# Patient Record
Sex: Female | Born: 1985 | Race: White | Hispanic: No | Marital: Married | Smoking: Never smoker
Health system: Southern US, Community
[De-identification: ages and names within clinical notes are randomized; demographics above are authoritative.]

## PROBLEM LIST (undated history)

## (undated) DIAGNOSIS — G90A Postural orthostatic tachycardia syndrome (POTS): Secondary | ICD-10-CM

## (undated) DIAGNOSIS — K589 Irritable bowel syndrome without diarrhea: Secondary | ICD-10-CM

## (undated) DIAGNOSIS — M359 Systemic involvement of connective tissue, unspecified: Secondary | ICD-10-CM

## (undated) HISTORY — DX: Postural orthostatic tachycardia syndrome (POTS): G90.A

## (undated) HISTORY — DX: Systemic involvement of connective tissue, unspecified: M35.9

---
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
## 2007-02-09 ENCOUNTER — Ambulatory Visit (HOSPITAL_COMMUNITY): Payer: Self-pay | Admitting: Psychiatry

## 2007-05-17 ENCOUNTER — Ambulatory Visit (HOSPITAL_COMMUNITY): Payer: Self-pay | Admitting: Psychiatry

## 2007-07-12 ENCOUNTER — Ambulatory Visit (HOSPITAL_COMMUNITY): Payer: Self-pay | Admitting: Psychiatry

## 2007-10-11 ENCOUNTER — Ambulatory Visit (HOSPITAL_COMMUNITY): Payer: Self-pay | Admitting: Psychiatry

## 2008-05-08 ENCOUNTER — Ambulatory Visit (HOSPITAL_COMMUNITY): Payer: Self-pay | Admitting: Psychiatry

## 2008-11-02 ENCOUNTER — Ambulatory Visit (HOSPITAL_COMMUNITY): Payer: Self-pay | Admitting: Psychiatry

## 2009-04-26 ENCOUNTER — Ambulatory Visit (HOSPITAL_COMMUNITY): Payer: Self-pay | Admitting: Psychiatry

## 2010-09-06 NOTE — H&P (Signed)
NAME:  Kelly Burke, Kelly Burke                         ACCOUNT NO.:  [id number]   MEDICAL RECORD NO.:  [id number]                   PATIENT TYPE:  INP   LOCATION:  0103                                 FACILITY:  [facility]   PHYSICIAN:  Beverly Milch, MD                  DATE OF BIRTH:  1985/10/15   DATE OF ADMISSION:  06/08/2003  DATE OF DISCHARGE:                         PSYCHIATRIC ADMISSION ASSESSMENT   IDENTIFYING DATA:  This 25 year old female, ninth grade student at [employer], was admitted emergently voluntarily on referral from  Dr. Duanne Guess of [facility] for inpatient  stabilization of depression and suicide plan to overdose.  The patient was  referred from apparently her first appointment at [facility] but  has a long history of depression and anxiety.  She suggests some previous  psychotherapy in an undisclosed location years ago for these problems.  She  is currently having progressive suicidal ideation and exacerbation of mood  and anxiety symptoms over the last two months even though the problems are  chronic.   HISTORY OF PRESENT ILLNESS:  The patient hesitates to open up and explain  the symptoms.  She cooperates enough to feel herself cordial and reasonable  but she is not sincere or resolving regarding accessing the conflicts and  the factors contributing to suicidal ideation.  The patient acknowledges  having multiple worries.  She feels that her father does not understand, so  she does not talk to father about problems.  Mother is overwhelmed, being  the patient's confident.  Mother notes mood swings, though these seem to be  produced by the patient's dependent compensations for her anxiety and  dysphoria.  She is dissatisfied with herself, her life and her future and  this seems to be present for at least 1-2 years.  Generalized anxiety seems  much longer with multiple somatic complaints.  The patient takes  multiple  medications for everything from asthma and allergy to constipation and  health and sleep concerns.  She tries to stay calm and to keep everything  inside but is not having success.  She is irritable, particularly at her  little brother.  She has significant guilty ruminations and feels hopeless  and helpless as well as worthless.  She is more irritable as she becomes  more anxious.  She does not acknowledge hallucinations or delusions.  She  does not have paranoia or dissociation.  The patient suggests she has not  resolved grandfather's death nine years ago and this may interfere with her  relations with father as well.  The family may move and they feel there is a  lot of upheaval in the family with father switching jobs and mother laid off  but having to go out of town on the weekend.  Grandmother had a stroke last  April, so that mother is with grandmother on weekends out of town.  The  family is financially stressed.  Father avoids acknowledging problems and  brother is upset about them.  The patient is anxious about school.  She can  exercise at times and play with her dog.   PAST MEDICAL HISTORY:  The patient is under the primary care of Dr. Azalia Bilis with [facility].  She had menarche at age 27 and menses are  regular.  She is not sexually active.  She has long history of seasonal  allergic rhinitis and asthma.  She has lax joints.  She reports a history of  heart murmur.  She also reports having subungual nevus of the right great  toenail.  She has an acute pharyngitis and has been on amoxicillin for four  days and states she must complete that treatment as well as continue her  Zyrtec-D and Singulair.  She has no history of seizure or syncope.  She has  no heart murmur or arrhythmia.   MEDICATIONS:  At the time of admission, her medications are not psychotropic  but include the following:   1. Amoxicillin 500 mg, take 2 b.i.d. x 6 days remaining.  2.  Zyrtec-D 1 b.i.d.  3. Singulair 10 mg at bedtime.  4. Melatonin 2 mg at bedtime, though she has not been taking it for some     time.  5. Vitamin C.  6. Calcium.  7. Fiber laxative.   ALLERGIES:  The patient has no medication allergies.   REVIEW OF SYSTEMS:  The patient denies difficulty gait, gaze or continence.  She denies exposure to communicable disease or toxins.  She denies rash,  jaundice or purpura.  There is no chest pain, palpitations or presyncope.  There is no abdominal pain, nausea, vomiting or diarrhea.  There is no  dysuria or arthralgia.   IMMUNIZATIONS:  Up to date.   FAMILY HISTORY:  The patient lives with both parents and younger brother.  She fusses at her younger brother unfairly.  Her maternal grandmother and  maternal great-aunt have depression.  Paternal cousins have ADHD and brother  has ADHD.  Maternal great-uncle has substance abuse with alcohol as does the  maternal great-aunt.  There is a family history of heart disease,  hypertension, glaucoma, hypotonia, cerebral palsy, stroke and cancer.   SOCIAL AND DEVELOPMENTAL HISTORY:  The patient is in the ninth grade at  [employer].  She has had no learning disabilities.  In  fact, she is an excellent student in her academics.  She is somewhat  immature in her social and relational life.  Her mood swings seem to be a  dependent compensation for her anxiety and depression.  The patient does not  acknowledge sexual activity.  She denies any substance abuse.  She is not  rule-breaking in general but is currently decompensating.   ASSETS:  The patient is intelligent.   MENTAL STATUS EXAM:  Weight is 136 pounds and height is 68 inches with blood  pressure 140/92 and heart rate 127 (supine) and standing blood pressure is  122/80 with heart rate of 144, though she is highly anxious at the time of  admission.  The patient has an intact neurological exam.  She has no abnormal involuntary  movements.  Gait and gaze are intact.  There are no  pathologic reflexes or soft neurologic findings.  Muscle strength and tone  are normal.  Cranial nerves 2-12 are intact.  AMRs and DTRs are intact.  She  is alert and oriented with speech  intact.  The patient has severe  intrapsychic anxiety and moderate to severe dysphoria.  She has chronic  dissatisfaction and disappointment with herself, her life and her future.  She presents some melancholic features that seem more likely to originate in  her dysthymic disorder than to have a superimposed major depression, though  such should be ruled out.  She has identity diffusion with dependent  features.  Her mood swings are seemingly accounted for by her dependent  compensation for anxiety and dysphoria as well as unresolved conflict.  She  has no psychotic symptoms.  She has intermittent suicidal ideation for the  last two months and now a plan to overdose.  She is not homicidal or  assaultive.  Repeat blood pressure the following day is 99/66 with heart  rate of 88 (supine) and standing blood pressure 101/68 with heart rate of  101.   IMPRESSION:   AXIS I:  1. Dysthymic disorder, early onset, severe with atypical features.  2. Generalized anxiety disorder.  3. Identity disorder with dependent features.  4. Parent-child problem.  5. Other specified family circumstances.   AXIS II:  Diagnosis deferred.   AXIS III:  1. Partially treated pharyngitis.  2. Allergic rhinitis and asthma.  3. History of heart murmur in the past.  4. History of right great toe subungual nevus.   AXIS IV:  Stressors:  Family--severe, acute and chronic; school--moderate,  acute; phase of life--severe, acute.   AXIS V:  Global Assessment of Functioning on admission 42; highest in the  last year 74.   PLAN:  The patient was admitted for inpatient adolescent psychiatric and  multidisciplinary, multimodal behavioral health treatment in a team-based  program at  a locked psychiatric unit.  The patient will start Remeron  pharmacotherapy and mother indicates that she should have a fiber laxative  to assure that she will not get constipated.  Will continue her Zyrtec-D,  amoxicillin as well as Singulair as required.  She will not need the  melatonin, vitamin C or calcium at this time but may resume this after  discharge.  Cognitive behavioral, desensitization and coping and social  skills therapies are planned.  Mobilization of anger and maturation are  planned and then can address with anger management to overcome regression.  Family therapy is also planned.   ESTIMATED LENGTH OF STAY:  Five to six days with target symptoms for  discharge being stabilization of suicide risk and mood, stabilization of  anxiety and regression and generalization of the capacity for safe,  effective participation in outpatient treatment.                                               Beverly Milch, MD   GJ/MEDQ  D:  06/09/2003  T:  06/09/2003  Job:  [id number]

## 2010-09-06 NOTE — H&P (Signed)
NAME:  GENETTE, HUERTAS                         ACCOUNT NO.:  [id number]   MEDICAL RECORD NO.:  [id number]                   PATIENT TYPE:  INP   LOCATION:  0199                                 FACILITY:  [facility]   PHYSICIAN:  Beverly Milch, MD                  DATE OF BIRTH:  10/19/1985   DATE OF ADMISSION:  10/17/2003  DATE OF DISCHARGE:                         PSYCHIATRIC ADMISSION ASSESSMENT   IDENTIFYING DATA:  This 73-1/23-year-old female, who will repeat the ninth  grade this fall, apparently at a school different from her [address], is admitted emergently voluntarily from [facility] Access and Intake Department where she was brought by mother  for inpatient stabilization of suicide risk, depression and anxiety.  Clarification of the optimal psychotherapeutic response to mother and  patient's presentation that the patient cannot keep from harming herself was  reviewed including an attempt made to review with Dr. Francia Greaves office  at 17:15.  The patient has failed the ninth grade after she and mother used  homebound to approach having missed many honors classes last school year.  The patient does not want to leave her home.  She is disappointed, hopeless,  helpless as well as being anxious and dissonant.  She reports auditory  hallucinations twice, including the first time, commanding that she kill  herself.  She has a plan to kill herself by overdose but has cut her wrist  with her nails the day before admission.   HISTORY OF PRESENT ILLNESS:  The patient is known from inpatient stay at the  [facility] February 17 through June 14, 2003.  This  hospitalization occurred from the first appointment with Dr. Janean Sark  Rembert with whom the patient continues psychotherapy as an outpatient  currently.  The patient indicates that she has support from mother and from  Dr. Heriberto Antigua.  The patient has recently changed doctors from Dr.  Mariana Single to  Dr. Milford Cage and has seen Dr. Katrinka Blazing once.  The patient has had a  medication change since her last hospitalization when she was discharged on  Remeron 15 mg nightly.  At this time, she arrived stating she takes 120 mg  of Cymbalta nightly.  The patient has also been receiving Sonata 10 mg at  bedtime for sleep.  The patient was suspected of possibly having a major  depressive episode during her last hospitalization but clinical course  documented dysthymic disorder without a major depressive episode in February  of 2005.  The patient's readmission has parallels with her first  hospitalization.  She seems morbidly fixated on illness and problems and  fuses with mother in that regard.  The patient and mother feel they cannot  establish self-control for safety.  The patient describes auditory  hallucinations commanding her to kill herself one month ago and then again  the night before admission when she heard her mother's voice  calling her  name but mother was not there.  The patient has insomnia, being worse again.  She has lost weight from 136 pounds on admission and 140 pounds on discharge  in February of 2005, now at 130 pounds.  The patient wonders if she has  anorexia.  She is somatically fixated on having multiple medical problems  including migraines, asthma, heart murmur and acne.   ADMISSION DIAGNOSES:  1. Cymbalta, reportedly 120 mg at bedtime, to be confirmed with Dr. Michaelle Copas     office.  2. Sonata 10 mg every bedtime.  3. Singulair 10 mg every bedtime.  4. Zyrtec D daily.  5. Fiber laxative p.r.n.  6. Albuterol inhaler p.r.n.   The patient's symptoms become self-defeating and mutually exacerbating such  that she and mother cannot work through a solution.  Disengagement from the  symptoms could not be accomplished in the access and intake office nor could  the patient commit or contract for safety in cooperation.  Mother could not  be reassuring.  The  patient is no longer seeing Dr. Mariana Single but now Dr.  Milford Cage for one visit.  The patient denies use of alcohol or illicit  drugs.  She denies other misperceptions.  She does not have post-traumatic  flashbacks and does not acknowledge significant psychic trauma.  She lives  with parents and is very dependent on mother.   PAST MEDICAL HISTORY:  The patient has a history of functional heart murmur.  She has a history of asthma and allergic rhinitis.  She has a history of  limited migraines occasionally.  She had menarche at age 47 and is not  sexually active.  She has had extraction of her wisdom teeth.  She is under  the pediatric care of Dr. Blanca Friend at [facility].  She has some  superficial lacerations from her fingernails on the forearm at the time of  admission.  She has had a history of a right great toe subungual nevus.  She  has had no medication allergies.  She has no history of seizure, arrhythmia  or syncope.   REVIEW OF SYSTEMS:  The patient denies difficulty with gait, gaze or  countenance.  She denies exposure to communicable disease or toxins.  She  denies rash, jaundice or purpura.  There is no chest pain, palpitations or  presyncope.  There is no abdominal pain, nausea, vomiting or diarrhea  currently.  There is no dysuria or arthralgia, though mother has worries  about many of these areas.   IMMUNIZATIONS:  Up to date.   FAMILY HISTORY:  The patient resides with both parents.  She has a brother  with ADHD as well as paternal cousins having the same.  Maternal grandmother  and maternal great-aunt have depression.  Maternal great-uncle and maternal  great-aunt have alcohol abuse.  The patient seems to fuse with enabling  mother.   SOCIAL AND DEVELOPMENTAL HISTORY:  The patient has failed the ninth grade at  [employer], missing many honors classes and using homebound as a substitution or makeup unsuccessfully.  She plans to transfer   schools to repeat the ninth grade or pursue some other arrangement. She  denies use of alcohol or illicit drugs.  She is not sexually active.   ASSETS:  The patient is intelligent.   MENTAL STATUS EXAM:  Height is 64-3/4 inches and weight is 130 pounds with  blood pressure 113/82 and heart rate 129 (sitting) and 104/58 with heart  rate of 150 (standing) suggesting significant anxiety.  The patient has an  intact neurological exam.  She manifests moderate psychomotor slowing that  seems dependent and depressive as well as anxious in origin.  The patient  has no abnormal involuntary movements.  There are no neurologic soft signs  or pathologic reflexes.  AMRs, cranial nerves and muscle strengths and tone  are normal.  Gait and gaze are intact.  She is alert and oriented fully.  The patient reports irresistible urge to harm herself including __________  and superficially lacerating her forearm with nails the day before admission  and planned to overdose on the day of admission.  She has morbid fixations  and seems hopeless and helpless in her dependence.  She has increased  avoidance and generalized anxiety.  She has severe dysthymic dysphoria.  She  reports auditory hallucinations twice, once of a command auditory  hallucinations to kill herself and, the night before admission, hearing her  mother's voice calling her name even though mother was not there.  She has  no other misperceptions at this time.  However, she does not open up readily  and address her problems but tends to talk only to mother and her therapist.  She does not use alcohol or illicit drugs.  She has active suicidal ideation  but is not homicidal or assaultive.   IMPRESSION:   AXIS I:  1. Dysthymic disorder, early onset, severe  2. Generalized anxiety disorder.  3. Identity disorder with dependent features.  4. Parent-child problem.  5. Other specified family circumstances.   AXIS II:  Diagnosis deferred.   AXIS  III:  1. Abrasions and lacerations.  2. Allergic rhinitis and asthma.  3. Irritable bowel syndrome.  4. Functional heart murmur.  5. Acne.  6. Weight loss of 6-10 pounds.   AXIS IV:  Stressors:  Family--severe, acute and chronic; school--severe,  acute and chronic; phase of life--severe, acute.   AXIS V:  Current Global Assessment of Functioning 41; highest Global  Assessment of Functioning in the last year 71.   PLAN:  The patient is admitted for inpatient adolescent psychiatric and  multidisciplinary, multimodal behavioral health treatment in a team-based  program at a locked psychiatric unit.  Cymbalta will initially be dosed at  60 mg nightly to modify, as per Dr. Michaelle Copas office, when they can be  reached.  Kathaleen Bury is not available in the hospital formulary and will  substitute Vistaril until home supply brought or discharge.  Will continue  other usual medications and assess the need for additional psychopharmacotherapy.  Will attempt to disengage from somatic fixations.  Individuation and separation as well as identity consolidation are important  to long-term treatment success.  Cognitive behavioral therapy, habit  reversal and object relations family therapy are planned currently.   ESTIMATED LENGTH OF STAY:  Five days with target symptoms for discharge  being stabilization of suicide risk and mood, stabilization of anxiety and  somatic fixations and generalization of the capacity for safe, effective  participation in outpatient treatment.                                               Beverly Milch, MD    GJ/MEDQ  D:  10/18/2003  T:  10/18/2003  Job:  [id number]

## 2010-09-06 NOTE — Discharge Summary (Signed)
NAME:  Kelly Burke, Kelly Burke                         ACCOUNT NO.:  [id number]   MEDICAL RECORD NO.:  [id number]                   PATIENT TYPE:  INP   LOCATION:  0103                                 FACILITY:  [facility]   PHYSICIAN:  Beverly Milch, MD                  DATE OF BIRTH:  [birth year]   DATE OF ADMISSION:  06/08/2003  DATE OF DISCHARGE:  06/14/2003                                 DISCHARGE SUMMARY   IDENTIFYING DATA:  This 25 year old female, ninth grade student at [employer], was admitted voluntarily on referral from Dr. Duanne Guess of [facility] for inpatient stabilization of  suicide risk including plan to overdose and depression.  The patient had a  long history of depression and anxiety but this was her first appointment at  [facility] on the day of admission.  She may have had  some previous psychotherapy remotely but cannot recall when or where.  She  has had suicidal ideation over the last two months.  For full details,  please see the typed admission assessment.   HISTORY OF PRESENT ILLNESS:  The patient is hesitant to open up and discuss  her symptoms.  She seems somewhat fixated in competition with her younger  brother.  Mother is overwhelmed as the patient confides all her problems in  mother and mother notes mood swings in addition to the patient's anxiety and  dysphoria.  The patient has generalized anxiety with multiple somatic  complaints.  Depressive symptoms are present for likely at least 1-2 years  and anxiety symptoms longer than that.  The patient does note some guilty  ruminations and feels relative hopeless and helpless at the time of  admission.  Mother is gone on weekends to care for her own mother out of  town.  The patient is under the primary care of Dr. Azalia Bilis and has  seasonal allergic rhinitis and asthma.  She reports a subungual nevus to the  right great toe and is completing  amoxicillin currently for pharyngitis.  She has no medication allergies.  Mother is concerned about the patient's  tendency to irritable bowel and constipation.  There is depression in a  maternal grandmother and maternal great-aunt.  Paternal cousins have ADHD as  does brother.  Maternal great-uncle has substance abuse with alcohol as does  the maternal great-aunt.   INITIAL MENTAL STATUS EXAM:  The patient has chronic dissatisfaction and  disappointment with herself, her life and her future.  She presents with  severe intrapsychic anxiety and moderate to severe dysphoria objectively.  She presents with a dysthymic disorder with possible superimposed major  depression with some melancholic features, particularly subjectively.  She  has a plan to overdose and a two-month history of intermittent suicidal  ideation.   LABORATORY DATA:  CBC was normal with white count 6900, hemoglobin 13.2, MCV  of 91  and platelet count 299,000.  Basic metabolic panel was normal with  sodium 140, potassium 3.8, glucose 86, creatinine 0.8 and calcium 10.2 with  albumin 3.7.  Hepatic function panel was normal with AST 14 and ALT 13 and  total bilirubin 0.7.  GGT was normal at 13.  TSH was normal at 3.993.  Urine  hCG was negative.  Urine drug screen was negative.  Urinalysis was normal  with specific gravity of 1028.  RPR was nonreactive.  Urine GC and CT probes  by DNA amplification were negative.   HOSPITAL COURSE AND TREATMENT:  General medical exam, by Vic Ripper,  P.A.-C., noted no medication allergies.  The family was stressed by mother  and father changing jobs.  The patient has a history of surgical removal of  her wisdom teeth.  Maternal grandmother has depression.  The patient had  menarche at age 52 and is not sexually active.  She may have a heart murmur  by history as well as occasional migraine.  General medical findings were  otherwise intact, particularly on examination.  The  patient's admission  height was 68 inches with weight of 136 pounds, blood pressure 140/92 and  heart rate 127 (supine) at the time of admission and blood pressure 122/80  with heart rate of 144 (standing), though she was anxious.  Repeat blood  pressure, the following day, was 99/66 with heart rate of 88 (supine) and  standing blood pressure 101/68 with heart rate of 101.  At the time of  discharge, blood pressure was 101/64 with heart rate of 96 (supine) and  88/44 with heart rate of 160 (standing).  Some of the patient's lability was  likely technical measurement process but overall  the patient appears to  have some vasomotor instability likely concomitant to her anxiety diathesis  as well.  Her weight was 140 pounds at the time of discharge.  She was not  felt to have any significant orthostasis from Remeron. She was started on  Remeron 15 mg nightly and tolerated the medication well.  She had no  significant side effects.  Her anxiety and depression did gradually  stabilize efficiently for the patient to participate very effectively in the  therapy process.  She participated in group, milieu, individual, family,  special education, anger management, occupation and therapeutic recreational  therapies.  The patient became capable of verbalizing her symptoms  psychologically and disengaging from somatoform fixations.  She did complain  of the food at the hospital and had stomachache at least one or two  mornings, limiting breakfast intake.  This was resolving by the time of  discharge.  She did complete the course of her amoxicillin during the  hospital stay.  The patient was not self-directed in treatment by the time  of discharge but was much more capable of participating under therapy  direction.  Remeron was safe and well-tolerated.  She had a final family  therapy session with both parents with brother remaining at school.  She generalized the communication skills to the family  level and did work on  several family issues in the session that could also be generalized for  outcome.  The patient was discharged free of suicidal ideation and with  improved function, safe for return home.  She did not manifest major  depressive symptoms over the course of the hospital stay.   FINAL DIAGNOSES:   AXIS I:  1. Dysthymic disorder, early onset, severe with atypical features.  2. Generalized anxiety  disorder.  3. Identity disorder with dependent features.  4. Parent-child problem.  5. Other specified family circumstances.   AXIS II:  Diagnosis deferred.   AXIS III:  1. Partially treated pharyngitis.  2. Allergic rhinitis and asthma.  3. History of cardiac murmur in the past.  4. History of right great toe subungual nevus.   AXIS IV:  Stressors:  Family--severe, acute and chronic; school--moderate,  acute; phase of life--severe, acute.   AXIS V:  Global Assessment of Functioning on admission 42; highest in last  year 74; discharge Global Assessment of Functioning 55.   CONDITION ON DISCHARGE:  The patient was discharged to parents in improved  condition free of suicidal ideation and safe for discharge.  She has crisis  and safety plans if needed.   DISCHARGE MEDICATIONS:  1. Remeron 15 mg tablet every bedtime; quantity #30 with one refill     prescribed.  2. Singulair 10 mg every bedtime; having a home supply.  3. Zyrtec-D 1 tablet twice daily; having a home supply.  4. Fiber laxative once daily; having a home supply.  5. Calcium twice daily; having a home supply.  6. Vitamin C every morning; having a home supply.   FOLLOW UP:  A copy of the patient's laboratory testing was sent with the  patient for use in upcoming medication monitoring appointment.  She will see  Dr. Mariana Single for this on July 19, 2003 at 9:15 a.m.  She will see Duanne Guess, Ph.D. on June 15, 2003 at 14:00 and June 19, 2003 at 17:00.   ACTIVITY AND DIET:  She follows a high  fiber diet and is encouraged to be  physically active with no restrictions on activity.   She will call for any interim difficulties, especially regarding medication  and they are educated on the FDA guideline for use of antidepressants in  persons less than age 26 regarding 1/50 being estimated by the FDA to have  suicide-related side effects and to require monitoring in a tapering fashion  over 90 days with parents and professionals.                                               Beverly Milch, MD    GJ/MEDQ  D:  06/15/2003  T:  06/17/2003  Job:  [id number]   cc:   Duanne Guess, Ph.D.  [facility]  [address]  [place], [place]  fax [phone]   Laney Pastor. Alnaquib, M.D.  Fax: [phone]

## 2010-09-06 NOTE — Discharge Summary (Signed)
NAME:  Kelly Burke, Kelly Burke                         ACCOUNT NO.:  [id number]   MEDICAL RECORD NO.:  [id number]                   PATIENT TYPE:  INP   LOCATION:  0104                                 FACILITY:  [facility]   PHYSICIAN:  Beverly Milch, MD                  DATE OF BIRTH:  [birth year]   DATE OF ADMISSION:  10/17/2003  DATE OF DISCHARGE:  10/24/2003                                 DISCHARGE SUMMARY   IDENTIFYING DATA:  This 45[age]1/23-year-old female, who will have to repeat the  ninth grade this fall, she hopes, at [employer], as she  failed the ninth grade at [employer] due to missing school and  school work with various somatic concerns is admitted emergently voluntarily  from [facility] for inpatient stabilization  of suicide risk and depression and anxiety.  The patient reports suicide  plan of overdosing and had cut her wrist bluntly the day before admission.  The patient has become progressively unable to leave her home due to anxiety  and progressively more despondent with herself and her life.  She is under  the ongoing psychiatric care of Dr. Milford Cage, seeing her only once thus  far and, prior to that, seeing Dr. Mariana Single.  She is under the ongoing  psychotherapeutic care of Dr. Janean Sark Rembert.  For full details, please  see the typed admission assessment.   HISTORY OF PRESENT ILLNESS:  The patient has one previous inpatient  treatment at [facility] June 08, 2003 through June 14, 2003.  She was discharged on Remeron 15 mg nightly from that  hospitalization, which was sought from her first outpatient psychotherapy  appointment with Dr. Heriberto Antigua as the patient opened up about suicidality with  her chronic depression.  She was not concluded to have major depressive  disorder as that hospitalization ensued but does have generalized anxiety  disorder and significant dependent characterologic  features.  At the time of  this admission, she is on Cymbalta 120 mg at bedtime and Sonata 10 mg every  bedtime in addition to Singulair 10 mg at bedtime, Zyrtec D daily, fiber  laxative p.r.n. and albuterol inhaler p.r.n.  She has lost weight from 136  pounds on last admission and 140 pounds on last discharge in February of  2005 to a current weight of 130 pounds.  The patient wonders if she has  anorexia.  She has multiple somatic fixations on migraines, asthma, heart  murmur and acne.  She is self-defeating and mutually exacerbated with her  various psychological and medical problems.  The patient overwhelms mother  and she depends on mother to solve these problems.  She had menarche at age  29 and is not sexually active.  She has a brother with ADHD and paternal  cousins with the same.  Maternal grandmother and maternal great-aunt have  depression.  Maternal  great-uncle and maternal great-aunt have alcohol  abuse.  Mother and patient seem to fuse in an enabling way.   INITIAL MENTAL STATUS EXAM:  The patient reports recent command auditory  hallucinations, commanding her to kill herself one month ago and the night  before admission and hearing her mother's voice calling her name in an odd  way.  The patient had severe dysthymic dysphoria with hopelessness and  helplessness, fixated in her dependence.  She has avoidant and generalized  anxiety with neurotic organization.  She has active suicidal ideation but is  not homicidal.   LABORATORY DATA:  Comprehensive metabolic panel was normal with sodium 141,  potassium 4.3, glucose 85, creatinine 0.7, calcium 9.7, AST 18, ALT 18,  albumin 3.6.  CBC was normal except MCHC borderline elevated at 34.8 with  reference range 32-34.  White count was normal at 7400, hemoglobin 13.4, MCV  of 89 and platelet count of 303,000.  TSH was normal at 3.131.  Urinalysis  was normal with specific gravity of 1.026 and pH 5.5.  Urine pregnancy test  was  negative.   HOSPITAL COURSE AND TREATMENT:  General medical exam, by Vic Ripper,  P.A.-C., noted past extraction of third molars and no medication allergies.  The patient suggested month at home is tense with mother losing her job and  maternal grandmother being depressed since having a stroke.  Father has a  new job and family is not getting along well with him with parents always  worried and talking about finances.  The patient reports history of heart  murmur, asthma, easy illness and headache.  Menses are regular with menarche  at age 68 and she is not sexually active.  She reports a history of heart  murmur, which is benign.  She is Tanner stage 5.  Admission weight was 130  pounds with height of 64-3/4 inches and blood pressure 113/82 and heart rate  of 129 (sitting) and standing blood pressure 104/58 with heart rate of 150  on admission.  At the time of discharge, the patient's weight was 132 pounds  and blood pressure 111/66 with heart rate of 91 (supine) and standing blood  pressure 83/55 with heart rate of 135, suggesting significant orthostatic  drop and this was episodic throughout the hospital stay, though not  consistent.  This was likely associated with weight loss.  Mother was afraid  of constipation and the patient used Citrucel once or twice daily but not  always consistently and did have a normal stool on the day of discharge.  The patient used Vistaril in place of Sonata as needed for sleep during her  hospital stay and did well without Sonata.  Mother felt the patient has  bipolar disorder from her study of the symptoms on the Internet.  Though the  patient reports times of rapid talking and normal to somewhat reactively  elevated mood, the patient primarily manifests pervasive dysphoria with  negative automatic cognitions and self-defeating dependency.  The patient  did participate in all aspects of treatment, including group, milieu, behavioral, individual,  family, special education, occupational and  therapeutic recreational as well as anger management therapies.  The patient  did manifest progressive independence in the course of hospital treatment  seemingly from her interactions with peers and adults in the treatment  program.  The patient did not regress with family therapy.  Mother was  highly worried about the patient and father, at the final family therapy  session, was anxious about  how to provide containment at home.  The patient  participated well in the final family therapy.  She expects college for her  profession as her goal.  The patient considers brother a source of  motivation to stop self-injury and to improve her mood.  She wants to be a  good role model and supportive for brother.  She requested more  communication from parents and feedback, even if for things she needs to  change.  The patient established a plan for family meetings at home and an  organizational list for family activities that can be coordinated among  members.  The family planned to work through high expressed emotion to  become more calm and supportive.  The patient suggested brother's eating  habits and father's negative moods trigger suicidal thoughts for her.  Again, a dependent superficial fixation instead of resolving underlying  dysthymic negative cognitions.  However, the family therapy could advance  understanding on the patient and parent's part of what work has to be done  in the future.  All medications, razors and other dangerous items are locked  at the home and crisis and safety plans are outlined if needed.  The  patient's Cymbalta was divided to 60 mg twice daily.  Lamictal was added for  augmentation, though mother also hoping it will stabilize mood lability at  home.  Sonata can be used p.r.n. at home, though it was not needed at the  hospital.  Precautions for orthostasis were outlined including adequate  hydration and nutrition and  the patient was gaining weight by the time of  discharge.   FINAL DIAGNOSES:   AXIS I:  1. Dysthymic disorder, early onset, severe with atypical features.  2. Generalized anxiety disorder.  3. Identity disorder with dependent features.  4. Parent-child problem.  5. Other specified family circumstances.   AXIS II:  Diagnosis deferred.   AXIS III:  1. Abrasions and lacerations.  2. Allergic rhinitis and asthma.  3. Irritable bowel syndrome.  4. Functional heart murmur.  5. Acne.  6. Mild orthostatic hypotension, asymptomatic, likely associated with 6-10     pound weight loss.   AXIS IV:  Stressors:  Family--severe, acute and chronic; school--severe,  acute and chronic; phase of life--severe, acute.   AXIS V:  Global Assessment of Functioning on admission 41; highest in the  last year 71 and discharge Global Assessment of Functioning 55.   CONDITION ON DISCHARGE:  The patient was tolerating Lamictal well at the time of discharge, having no rash or other side effects, though she stated  it took her a couple of days to get use to it because it caused some initial  drowsiness and dizziness.  The titration schedule to a targeted dose of 50  mg daily was established.   DISCHARGE MEDICATIONS:  1. Cymbalta 60 mg b.i.d. at breakfast and bedtime; quantity #60 with no     refill prescribed.  2. Lamictal 25 mg tablets, to take 1 every bedtime through November 01, 2003 for     a total of 15 days and then increase to 2 every bedtime; quantity #60     with no refill prescribed.  3. Sonata 10 mg every bedtime p.r.n. insomnia; having supply at home.  4. Singulair 10 mg every bedtime; having supply at home.  5. Zyrtec D every bedtime; having supply at home.  6. Albuterol inhaler as directed as needed for asthma; own supply at home.  7. Fiber laxative as needed as directed by  home supply.   DIET/ACTIVITY:  Hydration and nutrition education is provided and she has no  restrictions on activity  except orthostatic precautions.   FOLLOW UP:  Crisis and safety plans are outlined if needed.  She will see  Dr. Milford Cage October 30, 2003 at 15:30 for psychiatric care.  She will see  Duanne Guess, Ph.D. November 07, 2003 at 11 a.m. for individual and family  therapy.                                               Beverly Milch, MD    GJ/MEDQ  D:  10/25/2003  T:  10/26/2003  Job:  [id number]   cc:   Duanne Guess, Ph.D.  [address]  [place], [place] [address]   Jasmine Pang, M.D.  Fax: [phone]

## 2010-11-01 ENCOUNTER — Emergency Department (INDEPENDENT_AMBULATORY_CARE_PROVIDER_SITE_OTHER): Payer: BC Managed Care – PPO

## 2010-11-01 ENCOUNTER — Encounter: Payer: Self-pay | Admitting: *Deleted

## 2010-11-01 ENCOUNTER — Emergency Department (HOSPITAL_BASED_OUTPATIENT_CLINIC_OR_DEPARTMENT_OTHER)
Admission: EM | Admit: 2010-11-01 | Discharge: 2010-11-01 | Disposition: A | Discharge status: Home / Self Care | Payer: BC Managed Care – PPO | Attending: Emergency Medicine | Admitting: Emergency Medicine

## 2010-11-01 ENCOUNTER — Emergency Department (HOSPITAL_BASED_OUTPATIENT_CLINIC_OR_DEPARTMENT_OTHER): Payer: BC Managed Care – PPO

## 2010-11-01 DIAGNOSIS — S60229A Contusion of unspecified hand, initial encounter: Secondary | ICD-10-CM | POA: Insufficient documentation

## 2010-11-01 DIAGNOSIS — M25549 Pain in joints of unspecified hand: Secondary | ICD-10-CM

## 2010-11-01 DIAGNOSIS — Y92009 Unspecified place in unspecified non-institutional (private) residence as the place of occurrence of the external cause: Secondary | ICD-10-CM | POA: Insufficient documentation

## 2010-11-01 DIAGNOSIS — T148XXA Other injury of unspecified body region, initial encounter: Secondary | ICD-10-CM

## 2010-11-01 DIAGNOSIS — W19XXXA Unspecified fall, initial encounter: Secondary | ICD-10-CM

## 2010-11-01 NOTE — ED Notes (Signed)
Pt c/o right ahnd and wrist pain from fall today .

## 2010-11-01 NOTE — ED Notes (Signed)
Family at bedside. 

## 2010-11-01 NOTE — ED Provider Notes (Signed)
History   tripped / fell striking right hand against a doorjam  Chief Complaint  Patient presents with  . Hand Injury   Patient is a 25 y.o. female presenting with hand injury. The history is provided by the patient.  Hand Injury  The incident occurred 1 to 2 hours ago. The incident occurred at home. The injury mechanism was a fall. The pain is present in the right hand. The pain is mild. The symptoms are aggravated by palpation. She has tried ice for the symptoms. The treatment provided significant relief.    Past Medical History  Diagnosis Date  . Asthma     History reviewed. No pertinent past surgical history.  History reviewed. No pertinent family history.  History  Substance Use Topics  . Smoking status: Never Smoker   . Smokeless tobacco: Not on file  . Alcohol Use: No    OB History    Grav Para Term Preterm Abortions TAB SAB Ect Mult Living                  Review of Systems  Constitutional: Positive for unexpected weight change.  HENT: Negative.   Respiratory: Negative.   Musculoskeletal:       Trauma to right hand  Neurological: Negative.     Physical Exam  BP 125/73  Pulse 100  Temp(Src) 99.3 F (37.4 C) (Oral)  Resp 16  Wt 150 lb (68.04 kg)  SpO2 100%  LMP 10/16/2010  Physical Exam  Constitutional: She appears well-developed and well-nourished.  HENT:  Head: Normocephalic and atraumatic.  Eyes: EOM are normal.  Neck: Neck supple. No tracheal deviation present. No thyromegaly present.  Cardiovascular: Normal rate and regular rhythm.   No murmur heard. Pulmonary/Chest: Effort normal and breath sounds normal.  Abdominal: Soft. Bowel sounds are normal. She exhibits no distension. There is no tenderness.  Musculoskeletal: Normal range of motion. She exhibits no edema and no tenderness.       Right hand tender @ 3rd mcp joint dorsal aspect  Neurological: She is alert. Coordination normal.  Skin: Skin is warm and dry. No rash noted.  Psychiatric:  She has a normal mood and affect.    ED Course  Procedures  MDM Declines painm med in the ed. paln tylenol,return prn    Diagnoses that have been ruled out:  Diagnoses that are still under consideration:  Final diagnoses:  Contusion of unspecified site    Doug Sou, MD 11/01/10 1918

## 2010-11-01 NOTE — ED Notes (Signed)
MD at bedside. 

## 2015-04-13 ENCOUNTER — Other Ambulatory Visit: Payer: Self-pay | Admitting: Physician Assistant

## 2015-04-13 DIAGNOSIS — R1032 Left lower quadrant pain: Secondary | ICD-10-CM

## 2015-04-20 ENCOUNTER — Ambulatory Visit
Admission: RE | Admit: 2015-04-20 | Discharge: 2015-04-20 | Disposition: A | Discharge status: Home / Self Care | Payer: 59 | Source: Ambulatory Visit | Attending: Physician Assistant | Admitting: Physician Assistant

## 2015-04-20 ENCOUNTER — Other Ambulatory Visit: Payer: Self-pay | Admitting: Physician Assistant

## 2015-04-20 DIAGNOSIS — R1032 Left lower quadrant pain: Secondary | ICD-10-CM

## 2015-05-18 ENCOUNTER — Other Ambulatory Visit: Payer: Self-pay | Admitting: Physician Assistant

## 2015-05-18 DIAGNOSIS — R1032 Left lower quadrant pain: Secondary | ICD-10-CM

## 2015-06-01 ENCOUNTER — Ambulatory Visit
Admission: RE | Admit: 2015-06-01 | Discharge: 2015-06-01 | Disposition: A | Discharge status: Home / Self Care | Payer: BLUE CROSS/BLUE SHIELD | Source: Ambulatory Visit | Attending: Physician Assistant | Admitting: Physician Assistant

## 2015-06-01 DIAGNOSIS — R1032 Left lower quadrant pain: Secondary | ICD-10-CM

## 2015-06-01 MED ORDER — IOPAMIDOL (ISOVUE-300) INJECTION 61%
100.0000 mL | Freq: Once | INTRAVENOUS | Status: AC | PRN
  Administered 2015-06-01: 100 mL via INTRAVENOUS

## 2015-08-31 DIAGNOSIS — J029 Acute pharyngitis, unspecified: Secondary | ICD-10-CM | POA: Diagnosis not present

## 2015-08-31 DIAGNOSIS — J069 Acute upper respiratory infection, unspecified: Secondary | ICD-10-CM | POA: Diagnosis not present

## 2016-01-24 DIAGNOSIS — J351 Hypertrophy of tonsils: Secondary | ICD-10-CM | POA: Diagnosis not present

## 2016-01-24 DIAGNOSIS — Z309 Encounter for contraceptive management, unspecified: Secondary | ICD-10-CM | POA: Diagnosis not present

## 2016-01-24 DIAGNOSIS — F411 Generalized anxiety disorder: Secondary | ICD-10-CM | POA: Diagnosis not present

## 2016-02-19 DIAGNOSIS — Z01419 Encounter for gynecological examination (general) (routine) without abnormal findings: Secondary | ICD-10-CM | POA: Diagnosis not present

## 2016-02-28 DIAGNOSIS — J45909 Unspecified asthma, uncomplicated: Secondary | ICD-10-CM | POA: Diagnosis not present

## 2016-02-28 DIAGNOSIS — R05 Cough: Secondary | ICD-10-CM | POA: Diagnosis not present

## 2016-02-28 DIAGNOSIS — B349 Viral infection, unspecified: Secondary | ICD-10-CM | POA: Diagnosis not present

## 2016-03-18 DIAGNOSIS — Z30430 Encounter for insertion of intrauterine contraceptive device: Secondary | ICD-10-CM | POA: Diagnosis not present

## 2016-04-27 DIAGNOSIS — J209 Acute bronchitis, unspecified: Secondary | ICD-10-CM | POA: Diagnosis not present

## 2016-04-27 DIAGNOSIS — J069 Acute upper respiratory infection, unspecified: Secondary | ICD-10-CM | POA: Diagnosis not present

## 2016-05-14 DIAGNOSIS — J111 Influenza due to unidentified influenza virus with other respiratory manifestations: Secondary | ICD-10-CM | POA: Diagnosis not present

## 2017-01-27 DIAGNOSIS — F411 Generalized anxiety disorder: Secondary | ICD-10-CM | POA: Diagnosis not present

## 2017-01-27 DIAGNOSIS — N912 Amenorrhea, unspecified: Secondary | ICD-10-CM | POA: Diagnosis not present

## 2017-01-27 DIAGNOSIS — Z13220 Encounter for screening for lipoid disorders: Secondary | ICD-10-CM | POA: Diagnosis not present

## 2017-06-24 DIAGNOSIS — J029 Acute pharyngitis, unspecified: Secondary | ICD-10-CM | POA: Diagnosis not present

## 2017-06-24 DIAGNOSIS — J069 Acute upper respiratory infection, unspecified: Secondary | ICD-10-CM | POA: Diagnosis not present

## 2017-08-25 DIAGNOSIS — J019 Acute sinusitis, unspecified: Secondary | ICD-10-CM | POA: Diagnosis not present

## 2017-08-25 DIAGNOSIS — J069 Acute upper respiratory infection, unspecified: Secondary | ICD-10-CM | POA: Diagnosis not present

## 2017-09-03 DIAGNOSIS — Z30432 Encounter for removal of intrauterine contraceptive device: Secondary | ICD-10-CM | POA: Diagnosis not present

## 2017-09-03 DIAGNOSIS — Z3169 Encounter for other general counseling and advice on procreation: Secondary | ICD-10-CM | POA: Diagnosis not present

## 2018-01-29 DIAGNOSIS — F411 Generalized anxiety disorder: Secondary | ICD-10-CM | POA: Diagnosis not present

## 2018-01-29 DIAGNOSIS — M2550 Pain in unspecified joint: Secondary | ICD-10-CM | POA: Diagnosis not present

## 2018-01-29 DIAGNOSIS — M2560 Stiffness of unspecified joint, not elsewhere classified: Secondary | ICD-10-CM | POA: Diagnosis not present

## 2018-01-29 DIAGNOSIS — Z23 Encounter for immunization: Secondary | ICD-10-CM | POA: Diagnosis not present

## 2018-02-11 DIAGNOSIS — M25569 Pain in unspecified knee: Secondary | ICD-10-CM | POA: Diagnosis not present

## 2018-02-11 DIAGNOSIS — M79671 Pain in right foot: Secondary | ICD-10-CM | POA: Diagnosis not present

## 2018-02-11 DIAGNOSIS — M7989 Other specified soft tissue disorders: Secondary | ICD-10-CM | POA: Diagnosis not present

## 2018-02-11 DIAGNOSIS — D8989 Other specified disorders involving the immune mechanism, not elsewhere classified: Secondary | ICD-10-CM | POA: Diagnosis not present

## 2018-02-11 DIAGNOSIS — R5383 Other fatigue: Secondary | ICD-10-CM | POA: Diagnosis not present

## 2018-02-11 DIAGNOSIS — M2550 Pain in unspecified joint: Secondary | ICD-10-CM | POA: Diagnosis not present

## 2018-02-11 DIAGNOSIS — M542 Cervicalgia: Secondary | ICD-10-CM | POA: Diagnosis not present

## 2018-02-11 DIAGNOSIS — M79643 Pain in unspecified hand: Secondary | ICD-10-CM | POA: Diagnosis not present

## 2018-02-11 DIAGNOSIS — M79641 Pain in right hand: Secondary | ICD-10-CM | POA: Diagnosis not present

## 2018-02-11 DIAGNOSIS — M791 Myalgia, unspecified site: Secondary | ICD-10-CM | POA: Diagnosis not present

## 2018-02-11 DIAGNOSIS — M79672 Pain in left foot: Secondary | ICD-10-CM | POA: Diagnosis not present

## 2018-02-11 DIAGNOSIS — M545 Low back pain: Secondary | ICD-10-CM | POA: Diagnosis not present

## 2018-02-11 DIAGNOSIS — M25562 Pain in left knee: Secondary | ICD-10-CM | POA: Diagnosis not present

## 2018-02-11 DIAGNOSIS — M549 Dorsalgia, unspecified: Secondary | ICD-10-CM | POA: Diagnosis not present

## 2018-02-11 DIAGNOSIS — M79673 Pain in unspecified foot: Secondary | ICD-10-CM | POA: Diagnosis not present

## 2018-02-11 DIAGNOSIS — M25561 Pain in right knee: Secondary | ICD-10-CM | POA: Diagnosis not present

## 2018-02-11 DIAGNOSIS — M79642 Pain in left hand: Secondary | ICD-10-CM | POA: Diagnosis not present

## 2018-02-25 DIAGNOSIS — M791 Myalgia, unspecified site: Secondary | ICD-10-CM | POA: Diagnosis not present

## 2018-02-25 DIAGNOSIS — M2550 Pain in unspecified joint: Secondary | ICD-10-CM | POA: Diagnosis not present

## 2018-02-25 DIAGNOSIS — M79643 Pain in unspecified hand: Secondary | ICD-10-CM | POA: Diagnosis not present

## 2018-02-25 DIAGNOSIS — M7989 Other specified soft tissue disorders: Secondary | ICD-10-CM | POA: Diagnosis not present

## 2018-03-09 ENCOUNTER — Other Ambulatory Visit: Payer: Self-pay | Admitting: Rheumatology

## 2018-03-09 DIAGNOSIS — M545 Low back pain, unspecified: Secondary | ICD-10-CM

## 2018-03-20 ENCOUNTER — Other Ambulatory Visit: Payer: BLUE CROSS/BLUE SHIELD

## 2018-04-03 ENCOUNTER — Ambulatory Visit
Admission: RE | Admit: 2018-04-03 | Discharge: 2018-04-03 | Disposition: A | Discharge status: Home / Self Care | Payer: Self-pay | Source: Ambulatory Visit | Attending: Rheumatology | Admitting: Rheumatology

## 2018-04-03 DIAGNOSIS — M545 Low back pain, unspecified: Secondary | ICD-10-CM

## 2018-04-03 DIAGNOSIS — R5383 Other fatigue: Secondary | ICD-10-CM | POA: Diagnosis not present

## 2018-04-03 DIAGNOSIS — M25552 Pain in left hip: Secondary | ICD-10-CM | POA: Diagnosis not present

## 2018-04-03 MED ORDER — GADOBENATE DIMEGLUMINE 529 MG/ML IV SOLN
15.0000 mL | Freq: Once | INTRAVENOUS | Status: AC | PRN
  Administered 2018-04-03: 15 mL via INTRAVENOUS

## 2018-04-07 DIAGNOSIS — M7989 Other specified soft tissue disorders: Secondary | ICD-10-CM | POA: Diagnosis not present

## 2018-04-07 DIAGNOSIS — M79643 Pain in unspecified hand: Secondary | ICD-10-CM | POA: Diagnosis not present

## 2018-04-07 DIAGNOSIS — M791 Myalgia, unspecified site: Secondary | ICD-10-CM | POA: Diagnosis not present

## 2018-04-07 DIAGNOSIS — M2550 Pain in unspecified joint: Secondary | ICD-10-CM | POA: Diagnosis not present

## 2018-06-08 DIAGNOSIS — M7989 Other specified soft tissue disorders: Secondary | ICD-10-CM | POA: Diagnosis not present

## 2018-06-08 DIAGNOSIS — E559 Vitamin D deficiency, unspecified: Secondary | ICD-10-CM | POA: Diagnosis not present

## 2018-06-08 DIAGNOSIS — M1990 Unspecified osteoarthritis, unspecified site: Secondary | ICD-10-CM | POA: Diagnosis not present

## 2018-06-08 DIAGNOSIS — H04129 Dry eye syndrome of unspecified lacrimal gland: Secondary | ICD-10-CM | POA: Diagnosis not present

## 2018-06-08 DIAGNOSIS — M79643 Pain in unspecified hand: Secondary | ICD-10-CM | POA: Diagnosis not present

## 2018-09-07 DIAGNOSIS — M1990 Unspecified osteoarthritis, unspecified site: Secondary | ICD-10-CM | POA: Diagnosis not present

## 2018-09-07 DIAGNOSIS — M357 Hypermobility syndrome: Secondary | ICD-10-CM | POA: Diagnosis not present

## 2018-09-07 DIAGNOSIS — E559 Vitamin D deficiency, unspecified: Secondary | ICD-10-CM | POA: Diagnosis not present

## 2018-09-07 DIAGNOSIS — H04129 Dry eye syndrome of unspecified lacrimal gland: Secondary | ICD-10-CM | POA: Diagnosis not present

## 2019-01-25 DIAGNOSIS — E559 Vitamin D deficiency, unspecified: Secondary | ICD-10-CM | POA: Diagnosis not present

## 2019-01-25 DIAGNOSIS — M357 Hypermobility syndrome: Secondary | ICD-10-CM | POA: Diagnosis not present

## 2019-01-25 DIAGNOSIS — Z23 Encounter for immunization: Secondary | ICD-10-CM | POA: Diagnosis not present

## 2019-01-25 DIAGNOSIS — H04129 Dry eye syndrome of unspecified lacrimal gland: Secondary | ICD-10-CM | POA: Diagnosis not present

## 2019-01-25 DIAGNOSIS — M1990 Unspecified osteoarthritis, unspecified site: Secondary | ICD-10-CM | POA: Diagnosis not present

## 2019-03-08 DIAGNOSIS — N926 Irregular menstruation, unspecified: Secondary | ICD-10-CM | POA: Diagnosis not present

## 2019-03-23 DIAGNOSIS — N926 Irregular menstruation, unspecified: Secondary | ICD-10-CM | POA: Diagnosis not present

## 2019-03-23 DIAGNOSIS — Z3202 Encounter for pregnancy test, result negative: Secondary | ICD-10-CM | POA: Diagnosis not present

## 2019-04-01 DIAGNOSIS — G44209 Tension-type headache, unspecified, not intractable: Secondary | ICD-10-CM | POA: Diagnosis not present

## 2019-04-01 DIAGNOSIS — J029 Acute pharyngitis, unspecified: Secondary | ICD-10-CM | POA: Diagnosis not present

## 2019-04-01 DIAGNOSIS — J0100 Acute maxillary sinusitis, unspecified: Secondary | ICD-10-CM | POA: Diagnosis not present

## 2019-04-01 DIAGNOSIS — R0981 Nasal congestion: Secondary | ICD-10-CM | POA: Diagnosis not present

## 2019-06-26 ENCOUNTER — Ambulatory Visit: Payer: 59 | Attending: Internal Medicine

## 2019-06-26 DIAGNOSIS — Z23 Encounter for immunization: Secondary | ICD-10-CM | POA: Insufficient documentation

## 2019-06-26 NOTE — Progress Notes (Signed)
   Covid-19 Vaccination Clinic  Name:  Kelly Burke    MRN: [id number] DOB: January 02, 1986  06/26/2019  [name] was observed post Covid-19 immunization for 15 minutes without incident. She was provided with Vaccine Information Sheet and instruction to access the V-Safe system.   [name] was instructed to call 911 with any severe reactions post vaccine: Marland Kitchen Difficulty breathing  . Swelling of face and throat  . A fast heartbeat  . A bad rash all over body  . Dizziness and weakness   Immunizations Administered    Name Date Dose VIS Date Route   Pfizer COVID-19 Vaccine 06/26/2019  4:56 PM 0.3 mL 04/01/2019 Intramuscular   Manufacturer: [employer], [employer]   Lot: [id number]   NDC: [address]-7

## 2019-07-01 ENCOUNTER — Ambulatory Visit: Payer: 59

## 2019-07-07 IMAGING — MR MR SACRUM / SI JOINTS WO/W CM
5 of 8 series · 28 of 48 positions shown · IV contrast (15ml multihance)
Comparison: Pelvic CT 06/01/2015.

CLINICAL DATA: Low back pain and fatigue for 4 months. No known
injury or prior relevant surgery. Left hip pain. Evaluate for
sacroiliitis.

EXAM:
MRI PELVIS WITHOUT CONTRAST
TECHNIQUE: Multiplanar multisequence MR imaging of the pelvis with attention to
the sacrum and sacroiliac joints was performed. No intravenous
contrast was administered.

[Series 3: T1 · sagittal · 4.0mm · 0.75mm/px · 7 of 35 slices shown (1 of 3)]
[im 1/35]
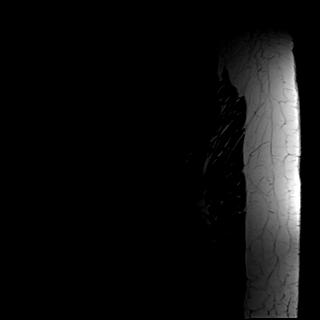
[im 6/35]
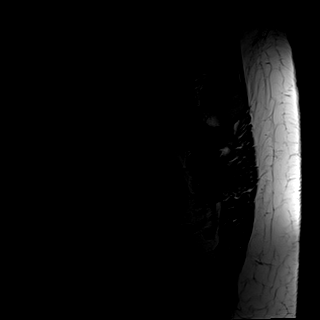
[im 12/35]
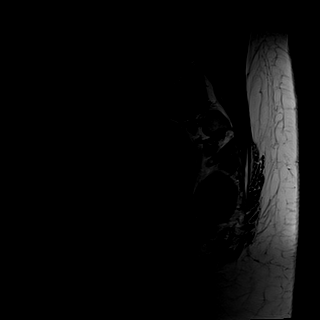
[im 18/35]
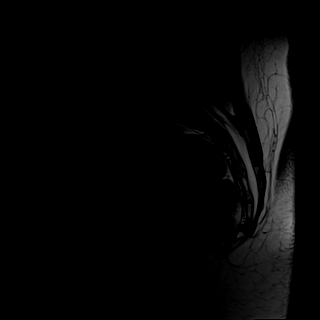
[im 23/35]
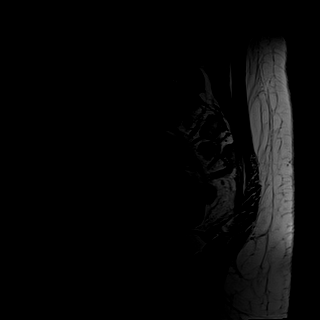
[im 29/35]
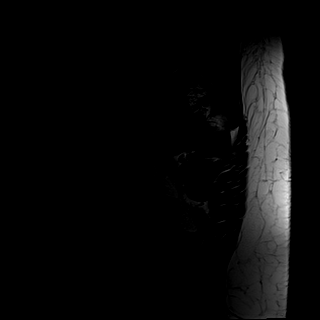
[im 35/35]
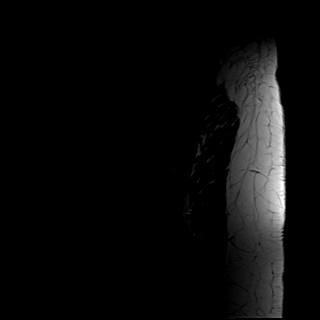

[Series 4: STIR · coronal · 4.0mm · 1.02mm/px · 3 of 14 slices shown]
[im 1/14]
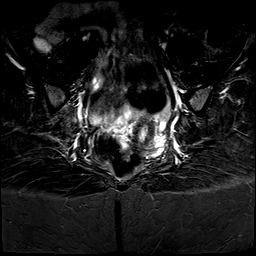
[im 7/14]
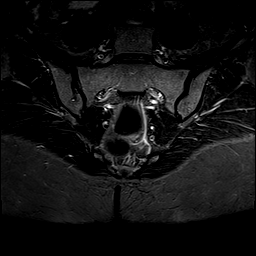
[im 14/14]
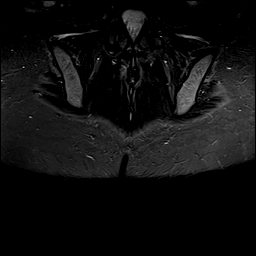

[Series 5: T1 · coronal · 4.0mm · 0.41mm/px · 3 of 14 slices shown (2 of 3)]
[im 1/14]
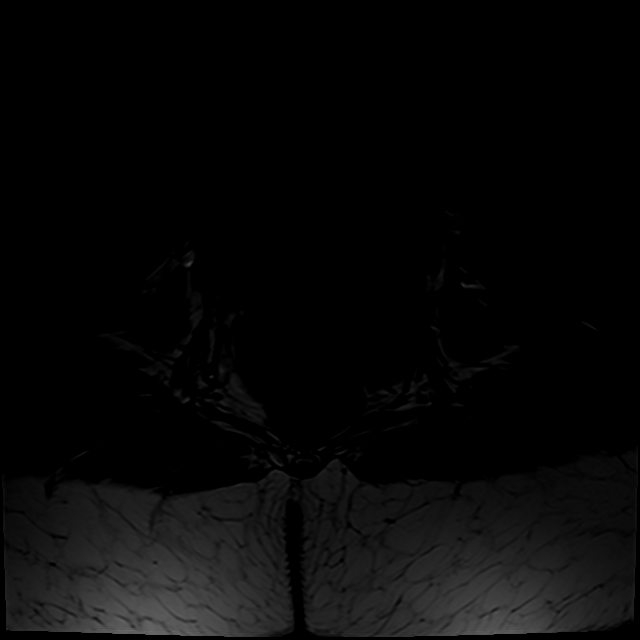
[im 7/14]
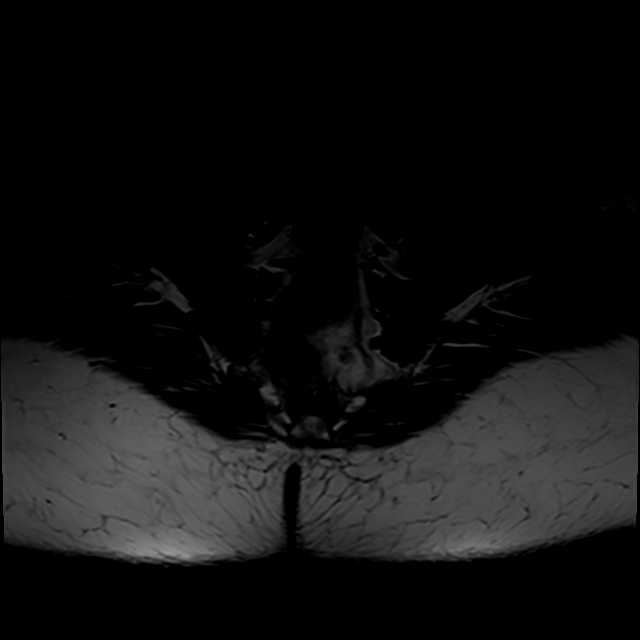
[im 14/14]
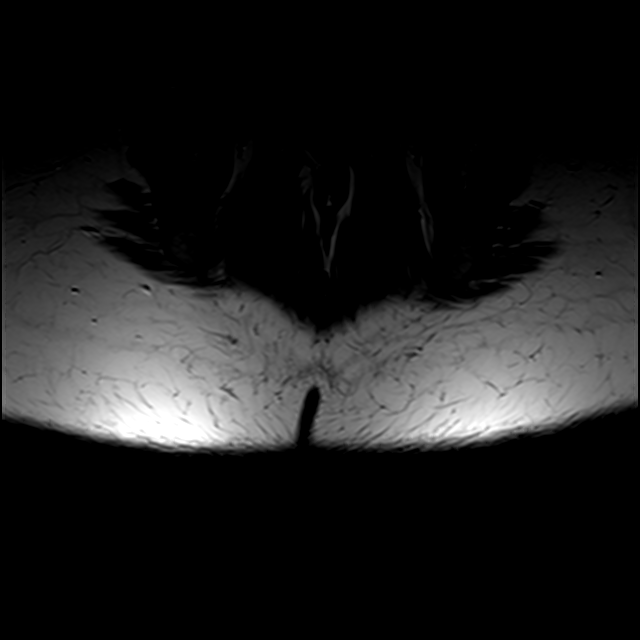

[Series 7: T1 · axial · 4.0mm · 0.62mm/px · z∈[-165,+64]mm · 8 of 45 slices shown (3 of 3)]
[im 1/45]
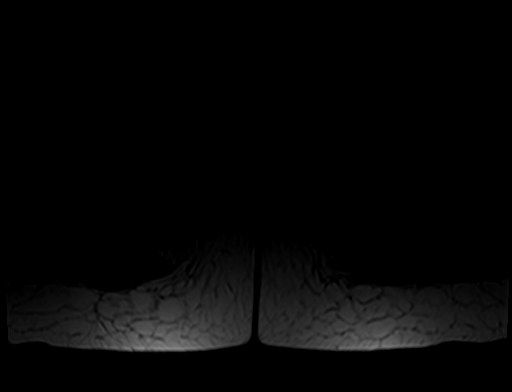
[im 7/45]
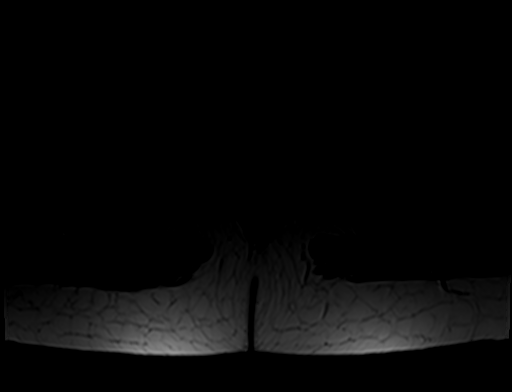
[im 13/45]
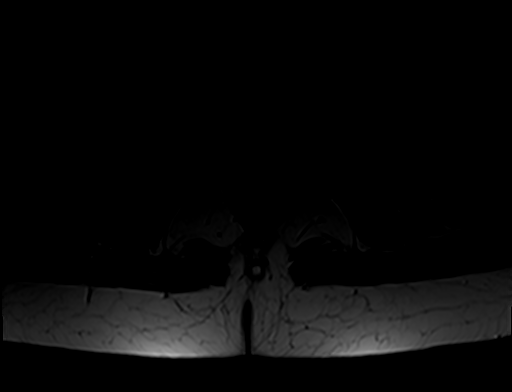
[im 19/45]
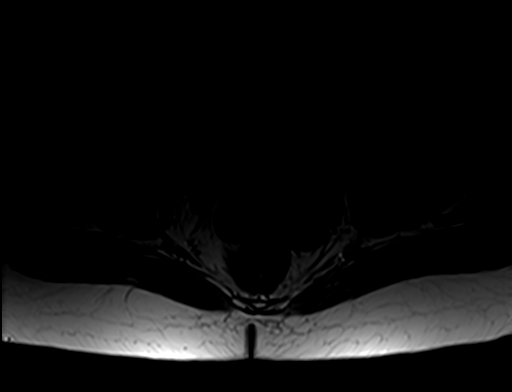
[im 26/45]
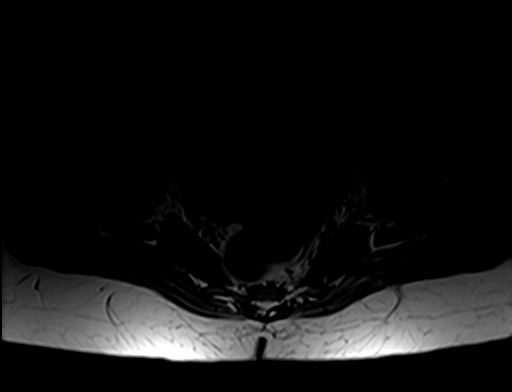
[im 32/45]
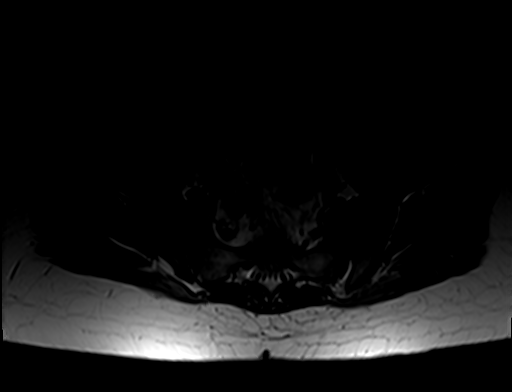
[im 38/45]
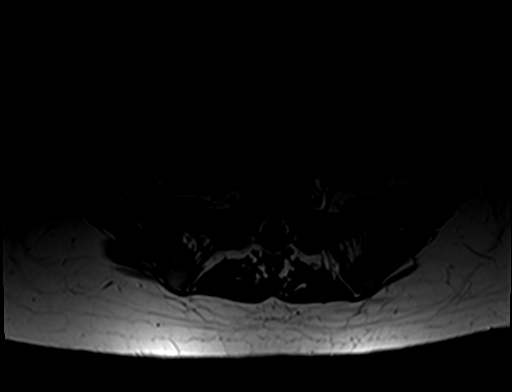
[im 45/45]
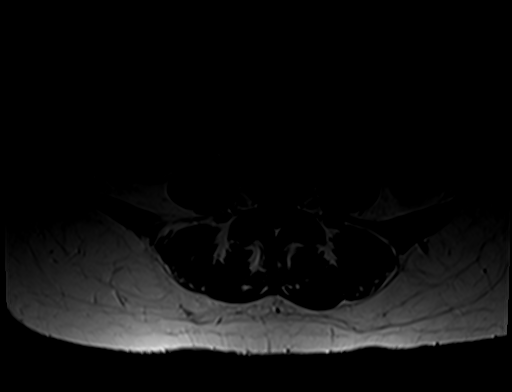

[Series 8: T2 fat-sat · axial · 4.0mm · 0.62mm/px · z∈[-165,+28]mm · 7 of 45 slices shown]
[im 1/45]
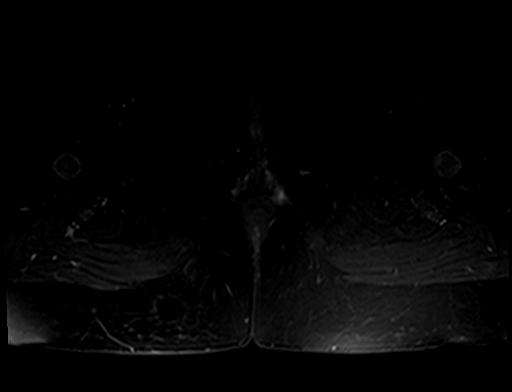
[im 7/45]
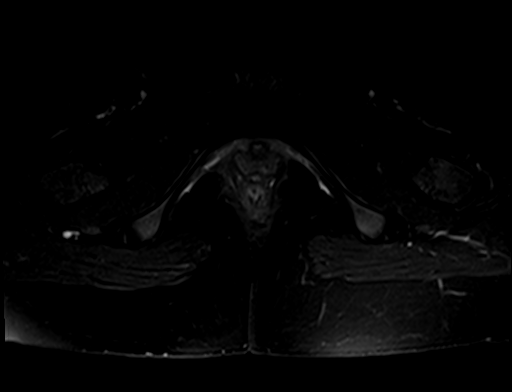
[im 13/45]
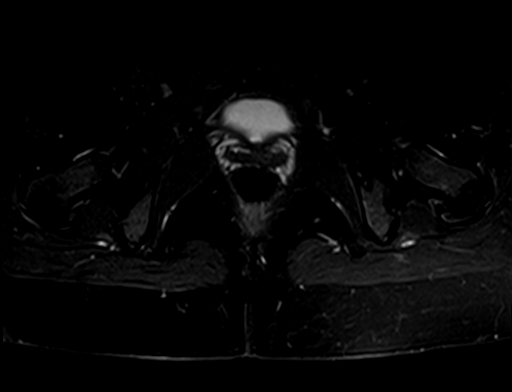
[im 19/45]
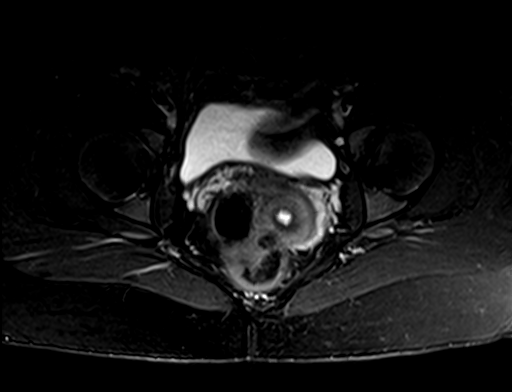
[im 26/45]
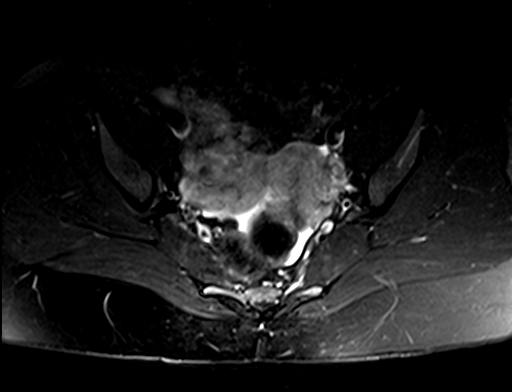
[im 32/45]
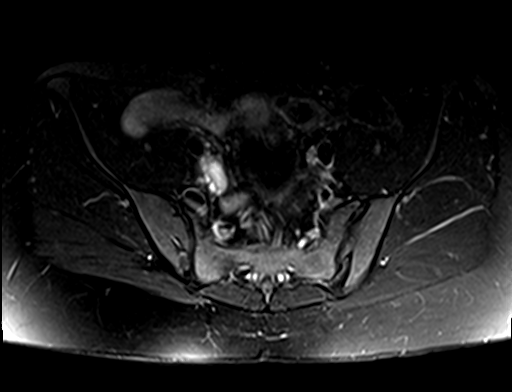
[im 38/45]
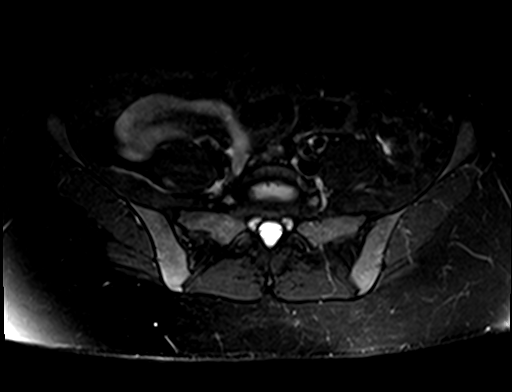

[28 of 48 positions shown; findings below may reference images not displayed]

FINDINGS: Urinary Tract: The visualized distal ureters and bladder appear
unremarkable.

Bowel: No bowel wall thickening, distention or surrounding
inflammation identified within the pelvis.

Vascular/Lymphatic: No enlarged pelvic lymph nodes identified. Small
external iliac nodes bilaterally are stable. No significant vascular
findings.

Reproductive: Collapsing right ovarian follicle. The uterus and
ovaries otherwise appear normal without suspicious adnexal findings.

Other: Physiologic free pelvic fluid.

Musculoskeletal: The sacroiliac joints appear normal bilaterally
without erosive changes or abnormal enhancement. The sacrum, coccyx
and visualized lower lumbar spine appear normal. The hip joints are
included on the axial images and appear normal. The visualized
pelvic muscles and tendons appear normal.
IMPRESSION: No evidence of sacroiliitis or other significant findings.

## 2019-07-27 ENCOUNTER — Ambulatory Visit: Payer: 59 | Attending: Internal Medicine

## 2019-07-27 DIAGNOSIS — Z23 Encounter for immunization: Secondary | ICD-10-CM

## 2019-07-27 NOTE — Progress Notes (Signed)
   Covid-19 Vaccination Clinic  Name:  STEFANNY PIERI    MRN: [id number] DOB: 1985/11/16  07/27/2019  Ms. Kizer was observed post Covid-19 immunization for 15 minutes without incident. She was provided with Vaccine Information Sheet and instruction to access the V-Safe system.   Ms. Delancey was instructed to call 911 with any severe reactions post vaccine: Marland Kitchen Difficulty breathing  . Swelling of face and throat  . A fast heartbeat  . A bad rash all over body  . Dizziness and weakness   Immunizations Administered    Name Date Dose VIS Date Route   Pfizer COVID-19 Vaccine 07/27/2019  9:22 AM 0.3 mL 04/01/2019 Intramuscular   Manufacturer: [employer], [employer]   Lot: [id number]   NDC: [address]-3

## 2019-09-14 ENCOUNTER — Ambulatory Visit (INDEPENDENT_AMBULATORY_CARE_PROVIDER_SITE_OTHER): Payer: 59 | Admitting: Otolaryngology

## 2019-10-05 ENCOUNTER — Ambulatory Visit (INDEPENDENT_AMBULATORY_CARE_PROVIDER_SITE_OTHER): Payer: No Typology Code available for payment source | Admitting: Otolaryngology

## 2019-10-05 ENCOUNTER — Other Ambulatory Visit: Payer: Self-pay

## 2019-10-05 ENCOUNTER — Encounter (INDEPENDENT_AMBULATORY_CARE_PROVIDER_SITE_OTHER): Payer: Self-pay | Admitting: Otolaryngology

## 2019-10-05 VITALS — Temp 97.7°F

## 2019-10-05 DIAGNOSIS — J351 Hypertrophy of tonsils: Secondary | ICD-10-CM

## 2019-10-05 DIAGNOSIS — J3501 Chronic tonsillitis: Secondary | ICD-10-CM

## 2019-10-05 NOTE — Progress Notes (Signed)
HPI: Kelly Burke is a 34 y.o. female who presents is referred by Dr. Drema Burke for evaluation of chronically enlarged tonsils.  Patient apparently has had history of tonsil problems for a number of years.  When she was younger they considered having her tonsils removed.  She has frequent tonsil stones as well as the tonsils will swell at times that cause ear pain especially on the left side occasional sore throat.  She has not had any recent strep or history of mono. She is otherwise healthy but does have inflammatory arthritis for which she takes Plaquenil. She also has history of allergies for which she uses Allegra and Flonase. She does not smoke.  Past Medical History:  Diagnosis Date  . Asthma    No past surgical history on file. Social History   Socioeconomic History  . Marital status: Married    Spouse name: Not on file  . Number of children: Not on file  . Years of education: Not on file  . Highest education level: Not on file  Occupational History  . Not on file  Tobacco Use  . Smoking status: Never Smoker  . Smokeless tobacco: Never Used  Substance and Sexual Activity  . Alcohol use: No  . Drug use: No  . Sexual activity: Never  Other Topics Concern  . Not on file  Social History Narrative  . Not on file   Social Determinants of Health   Financial Resource Strain:   . Difficulty of Paying Living Expenses:   Food Insecurity:   . Worried About Charity fundraiser in the Last Year:   . Arboriculturist in the Last Year:   Transportation Needs:   . Film/video editor (Medical):   Marland Kitchen Lack of Transportation (Non-Medical):   Physical Activity:   . Days of Exercise per Week:   . Minutes of Exercise per Session:   Stress:   . Feeling of Stress :   Social Connections:   . Frequency of Communication with Friends and Family:   . Frequency of Social Gatherings with Friends and Family:   . Attends Religious Services:   . Active Member of Clubs or Organizations:   .  Attends Archivist Meetings:   Marland Kitchen Marital Status:    No family history on file. Allergies  Allergen Reactions  . Latex   . Nexium [Esomeprazole]    Prior to Admission medications   Medication Sig Start Date End Date Taking? Authorizing Provider  albuterol (PROVENTIL) (5 MG/ML) 0.5% nebulizer solution Take 2.5 mg by nebulization every 6 (six) hours as needed.     Yes [provider]     Positive ROS: Otherwise negative  All other systems have been reviewed and were otherwise negative with the exception of those mentioned in the HPI and as above.  Physical Exam:  Constitutional: Alert, well-appearing, no acute distress Ears: External ears without lesions or tenderness. Ear canals are clear bilaterally with intact, clear TMs bilaterally. Nasal: External nose without lesions. Septum midline with mild rhinitis and clear nasal passages otherwise with no signs of infection.  Both millimeters regions are clear.. Oral: Lips and gums without lesions. Tongue and palate mucosa without lesions. Posterior oropharynx clear.  She has moderate size 2+ tonsils bilaterally with the left side slightly larger than the right.  No acute exudate.  No abnormalities noted otherwise. Neck: No palpable adenopathy or masses in the neck. Respiratory: Breathing comfortably.  Lungs clear to auscultation Cardiac exam: Regular rate  and rhythm without murmur Skin: No facial/neck lesions or rash noted.  Procedures  Assessment: Moderate tonsillar approach with the left side larger than the right with normal tonsil exam otherwise.  Plan: Kelly Burke is interested in having her tonsils removed.  I reviewed with her concerning the morbidity associated with tonsillectomy.  Recommended weighing the problems that the tonsils cause versus the morbidity associated with the surgery.  She will also have to be out of work for 10 to 14 days.  She will call us back if she decides to schedule tonsillectomy. More  recently has had more problems especially on the left side.   Kelly Bonds, MD   CC:

## 2021-03-20 ENCOUNTER — Encounter: Payer: Self-pay | Admitting: Pulmonary Disease

## 2021-03-20 ENCOUNTER — Ambulatory Visit (INDEPENDENT_AMBULATORY_CARE_PROVIDER_SITE_OTHER): Payer: No Typology Code available for payment source | Admitting: Pulmonary Disease

## 2021-03-20 ENCOUNTER — Other Ambulatory Visit: Payer: Self-pay

## 2021-03-20 VITALS — BP 130/82 | HR 101 | Temp 97.8°F | Ht 68.0 in | Wt 223.8 lb

## 2021-03-20 DIAGNOSIS — J4540 Moderate persistent asthma, uncomplicated: Secondary | ICD-10-CM | POA: Diagnosis not present

## 2021-03-20 DIAGNOSIS — J302 Other seasonal allergic rhinitis: Secondary | ICD-10-CM

## 2021-03-20 MED ORDER — FLUTICASONE FUROATE-VILANTEROL 200-25 MCG/ACT IN AEPB: 1.0000 | INHALATION_SPRAY | Freq: Every day | RESPIRATORY_TRACT | 0 refills | Status: DC

## 2021-03-20 MED ORDER — MONTELUKAST SODIUM 10 MG PO TABS: 10.0000 mg | ORAL_TABLET | Freq: Every day | ORAL | 11 refills | Status: DC

## 2021-03-20 NOTE — Progress Notes (Signed)
Synopsis: Referred in November 2022 for Asthma by Dahlia Byes, NP  Subjective:   PATIENT ID: Kelly Burke GENDER: female DOB: 01-03-1986, MRN: [id number]  HPI  Chief Complaint  Patient presents with   Consult    Consult regarding asthma.     Kelly Burke is a 35 year old woman, never smoker who is referred to pulmonary clinic for asthma.   She reports having asthma since 35 years old in which she only required and as needed albuterol inhaler.  Over this year she reports increasing shortness of breath, chest tightness and wheezing.  Her primary care team started her on inhaled corticosteroid therapy and recently increased her Flovent discus to 250 MCG 1 puff twice daily.  She continues to use albuterol on a regular basis 2-4 times per day.  She denies any nighttime awakenings with cough, wheezing or shortness of breath.  She reports significant environmental allergies worse in the spring and fall.  She does have cats at home and carpet flooring in the upstairs of her home and in her bedroom.  She is using Pepcid 10 mg twice daily for history of GERD.  She reports it is well controlled at this time as she is avoiding trigger foods.  She does drink caffeine throughout the day.  She is using fluticasone nasal spray 1 spray per nostril daily for allergies.  She is also taking Allegra daily.  She previously tried Singulair in the summer for 1 month and did not report much improvement in her symptoms.  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   Past Medical History:  Diagnosis Date   Asthma      Family History  Problem Relation Age of Onset   Asthma Mother    Heart disease Father    Asthma Brother      Social History   Socioeconomic History   Marital status: Married    Spouse name: Not on file   Number of children: Not on file   Years of education: Not on file   Highest education level: Not on file  Occupational History   Not on file   Tobacco Use   Smoking status: Never   Smokeless tobacco: Never  Substance and Sexual Activity   Alcohol use: No   Drug use: No   Sexual activity: Never  Other Topics Concern   Not on file  Social History Narrative   Not on file   Social Determinants of Health   Financial Resource Strain: Not on file  Food Insecurity: Not on file  Transportation Needs: Not on file  Physical Activity: Not on file  Stress: Not on file  Social Connections: Not on file  Intimate Partner Violence: Not on file     Allergies  Allergen Reactions   Latex    Nexium [Esomeprazole]      Outpatient Medications Prior to Visit  Medication Sig Dispense Refill   albuterol (PROVENTIL) (5 MG/ML) 0.5% nebulizer solution Take 2.5 mg by nebulization every 6 (six) hours as needed.       albuterol (VENTOLIN HFA) 108 (90 Base) MCG/ACT inhaler Inhale into the lungs.     B Complex Vitamins (VITAMIN B COMPLEX) TABS See admin instructions.     Cholecalciferol (VITAMIN D) 50 MCG (2000 UT) CAPS 1 capsule     famotidine (PEPCID) 10 MG tablet 1 tablet as needed     fexofenadine (ALLEGRA) 180 MG tablet Take 180 mg by mouth daily.  fluticasone (FLONASE) 50 MCG/ACT nasal spray 1 spray in each nostril     gabapentin (NEURONTIN) 100 MG capsule Take by mouth.     hydroxychloroquine (PLAQUENIL) 200 MG tablet 1 tablet     sertraline (ZOLOFT) 100 MG tablet TAKE 1 & 1/2 TABLET BY MOUTH ONCE A DAY *PER DR, NEED APPT*     fexofenadine-pseudoephedrine (ALLEGRA-D 24) 180-240 MG 24 hr tablet Take by mouth.     FLOVENT DISKUS 250 MCG/ACT AEPB Inhale 1 puff into the lungs 2 (two) times daily.     No facility-administered medications prior to visit.    Review of Systems  Constitutional:  Negative for chills, fever, malaise/fatigue and weight loss.  HENT:  Positive for congestion and sore throat. Negative for sinus pain.   Eyes: Negative.   Respiratory:  Positive for cough, sputum production, shortness of breath and wheezing.  Negative for hemoptysis.   Cardiovascular:  Positive for chest pain. Negative for palpitations, orthopnea, claudication and leg swelling.  Gastrointestinal:  Positive for heartburn. Negative for abdominal pain, nausea and vomiting.  Genitourinary: Negative.   Musculoskeletal:  Positive for joint pain. Negative for myalgias.  Skin:  Negative for rash.  Neurological:  Negative for weakness.  Endo/Heme/Allergies: Negative.   Psychiatric/Behavioral: Negative.     Objective:   Vitals:   03/20/21 1157  BP: 130/82  Pulse: (!) 101  Temp: 97.8 F (36.6 C)  TempSrc: Oral  SpO2: 99%  Weight: 223 lb 12.8 oz (101.5 kg)  Height: 5\' 8"  (1.727 m)   Physical Exam Constitutional:      General: She is not in acute distress.    Appearance: She is not ill-appearing.  HENT:     Head: Normocephalic and atraumatic.  Eyes:     General: No scleral icterus.    Conjunctiva/sclera: Conjunctivae normal.     Pupils: Pupils are equal, round, and reactive to light.  Cardiovascular:     Rate and Rhythm: Normal rate and regular rhythm.     Pulses: Normal pulses.     Heart sounds: Normal heart sounds. No murmur heard. Pulmonary:     Effort: Pulmonary effort is normal.     Breath sounds: Normal breath sounds. No wheezing, rhonchi or rales.  Abdominal:     General: Bowel sounds are normal.     Palpations: Abdomen is soft.  Musculoskeletal:     Right lower leg: No edema.     Left lower leg: No edema.  Lymphadenopathy:     Cervical: No cervical adenopathy.  Skin:    General: Skin is warm and dry.  Neurological:     General: No focal deficit present.     Mental Status: She is alert.  Psychiatric:        Mood and Affect: Mood normal.        Behavior: Behavior normal.        Thought Content: Thought content normal.        Judgment: Judgment normal.   CBC No results found for: WBC, RBC, HGB, HCT, PLT, MCV, MCH, MCHC, RDW, LYMPHSABS, MONOABS, EOSABS, BASOSABS   Chest imaging:  PFT: No flowsheet  data found.  Labs:  Path:  Echo:  Heart Catheterization:  Assessment & Plan:   Moderate persistent asthma without complication - Plan: fluticasone furoate-vilanterol (BREO ELLIPTA) 200-25 MCG/ACT AEPB, montelukast (SINGULAIR) 10 MG tablet, Pulmonary Function Test, CBC with Differential, IgE  Seasonal allergies  Discussion: Kelly Burke is a 35 year old woman, never smoker who is referred to pulmonary clinic  for asthma.   She appears to have moderate persistent asthma giving continued symptoms despite moderate dose ICS treatment.  We will start her on Breo Ellipta 200-25 MCG 1 puff daily along with as needed albuterol.  We will check CBC with differential and IgE level for further evaluation.  She is to start montelukast 10 mg daily for her history of allergies.  Continue Allegra daily and fluticasone nasal spray 1 spray per nostril daily for her allergies.  We will check pulmonary function testing at follow-up visit in 3 months.  Melody Comas, MD [facility] Office: [phone]   Current Outpatient Medications:    albuterol (PROVENTIL) (5 MG/ML) 0.5% nebulizer solution, Take 2.5 mg by nebulization every 6 (six) hours as needed.  , Disp: , Rfl:    albuterol (VENTOLIN HFA) 108 (90 Base) MCG/ACT inhaler, Inhale into the lungs., Disp: , Rfl:    B Complex Vitamins (VITAMIN B COMPLEX) TABS, See admin instructions., Disp: , Rfl:    Cholecalciferol (VITAMIN D) 50 MCG (2000 UT) CAPS, 1 capsule, Disp: , Rfl:    famotidine (PEPCID) 10 MG tablet, 1 tablet as needed, Disp: , Rfl:    fexofenadine (ALLEGRA) 180 MG tablet, Take 180 mg by mouth daily., Disp: , Rfl:    fluticasone (FLONASE) 50 MCG/ACT nasal spray, 1 spray in each nostril, Disp: , Rfl:    fluticasone furoate-vilanterol (BREO ELLIPTA) 200-25 MCG/ACT AEPB, Inhale 1 puff into the lungs daily., Disp: 28 each, Rfl: 0   gabapentin (NEURONTIN) 100 MG capsule, Take by mouth., Disp: , Rfl:     hydroxychloroquine (PLAQUENIL) 200 MG tablet, 1 tablet, Disp: , Rfl:    montelukast (SINGULAIR) 10 MG tablet, Take 1 tablet (10 mg total) by mouth at bedtime., Disp: 30 tablet, Rfl: 11   sertraline (ZOLOFT) 100 MG tablet, TAKE 1 & 1/2 TABLET BY MOUTH ONCE A DAY *PER DR, NEED APPT*, Disp: , Rfl:

## 2021-03-20 NOTE — Patient Instructions (Addendum)
Start breo ellipta 1 puff daily - rinse mouth out after each use  Let us know in 3-4 weeks how you are doing on the breo. We will then send in refills or try a different inhaler at that time.   Start montelukast 10mg  daily for allergies  Continue allegra daily  Continue fluticasone nasal spray daily  We will check labs for allergic markers of asthma  Follow up in 3 months with pulmonary function tests

## 2021-04-15 ENCOUNTER — Other Ambulatory Visit: Payer: Self-pay | Admitting: Pulmonary Disease

## 2021-04-15 DIAGNOSIS — J4540 Moderate persistent asthma, uncomplicated: Secondary | ICD-10-CM

## 2021-05-16 ENCOUNTER — Telehealth: Payer: Self-pay | Admitting: Pharmacy Technician

## 2021-05-16 ENCOUNTER — Other Ambulatory Visit (HOSPITAL_COMMUNITY): Payer: Self-pay

## 2021-05-16 NOTE — Telephone Encounter (Signed)
Patient Advocate Encounter  Received notification from [facility]) that prior authorization for BREO ELLIPTA 200MCG is required.   PA NOT NEEDED. GENERIC IS NOT COVERED BY PLAN. CALLED PHARMACY AND PROCESSED BRAND SUCCESSFULLY.  Key [id number]  Status is pending   [facility] will continue to follow  Luciano Cutter, CPhT Patient Advocate Phone: [phone] Fax:  [phone]

## 2021-06-05 ENCOUNTER — Ambulatory Visit: Payer: No Typology Code available for payment source | Admitting: Pulmonary Disease

## 2021-07-26 ENCOUNTER — Ambulatory Visit (INDEPENDENT_AMBULATORY_CARE_PROVIDER_SITE_OTHER): Payer: No Typology Code available for payment source | Admitting: Pulmonary Disease

## 2021-07-26 ENCOUNTER — Encounter: Payer: Self-pay | Admitting: Pulmonary Disease

## 2021-07-26 ENCOUNTER — Ambulatory Visit: Payer: No Typology Code available for payment source | Admitting: Pulmonary Disease

## 2021-07-26 DIAGNOSIS — J4540 Moderate persistent asthma, uncomplicated: Secondary | ICD-10-CM | POA: Diagnosis not present

## 2021-07-26 LAB — PULMONARY FUNCTION TEST
DL/VA % pred: 105 %
DL/VA: 4.63 ml/min/mmHg/L
DLCO cor % pred: 119 %
DLCO cor: 30.91 ml/min/mmHg
DLCO unc % pred: 119 %
DLCO unc: 30.91 ml/min/mmHg
FEF 25-75 Post: 4.69 L/sec
FEF 25-75 Pre: 4.72 L/sec
FEF2575-%Change-Post: 0 %
FEF2575-%Pred-Post: 129 %
FEF2575-%Pred-Pre: 129 %
FEV1-%Change-Post: 0 %
FEV1-%Pred-Post: 121 %
FEV1-%Pred-Pre: 121 %
FEV1-Post: 4.34 L
FEV1-Pre: 4.35 L
FEV1FVC-%Change-Post: 0 %
FEV1FVC-%Pred-Pre: 99 %
FEV6-%Change-Post: 0 %
FEV6-%Pred-Post: 122 %
FEV6-%Pred-Pre: 122 %
FEV6-Post: 5.24 L
FEV6-Pre: 5.21 L
FEV6FVC-%Change-Post: 0 %
FEV6FVC-%Pred-Post: 101 %
FEV6FVC-%Pred-Pre: 101 %
FVC-%Change-Post: 0 %
FVC-%Pred-Post: 121 %
FVC-%Pred-Pre: 120 %
FVC-Post: 5.24 L
FVC-Pre: 5.22 L
Post FEV1/FVC ratio: 83 %
Post FEV6/FVC ratio: 100 %
Pre FEV1/FVC ratio: 83 %
Pre FEV6/FVC Ratio: 100 %
RV % pred: 88 %
RV: 1.48 L
TLC % pred: 118 %
TLC: 6.78 L

## 2021-07-26 MED ORDER — FLUTICASONE FUROATE-VILANTEROL 200-25 MCG/ACT IN AEPB: INHALATION_SPRAY | RESPIRATORY_TRACT | 0 refills | Status: DC

## 2021-07-26 NOTE — Progress Notes (Signed)
PFT done today. 

## 2021-07-26 NOTE — Progress Notes (Signed)
? ?Synopsis: Referred in November 2022 for Asthma by Kelly Byes, NP ? ?Subjective:  ? ?PATIENT ID: Kelly Burke GENDER: female DOB: 1985/08/11, MRN: [id number] ? ?HPI ? ?Chief Complaint  ?Patient presents with  ? Follow-up  ?  F/U after PFT. States she has been doing well since last visit.   ? ? ?Eleasha Burke is a 36 year old woman, never smoker who returns to pulmonary clinic for asthma.  ? ?She has been doing well since last visit.  She reports improvement in her asthma symptoms since being started on Breo Ellipta 200-25 mcg 1 puff daily.  She reports rare use of albuterol inhaler.  She had flu a couple months ago where she had an increase in symptoms but these have returned back to normal.  She continues to use Flonase, Allegra and Singulair for allergies.  She denies any nighttime awakenings. ? ?OV 03/20/21 ?She reports having asthma since 36 years old in which she only required and as needed albuterol inhaler. ? ?Over this year she reports increasing shortness of breath, chest tightness and wheezing.  Her primary care team started her on inhaled corticosteroid therapy and recently increased her Flovent discus to 250 MCG 1 puff twice daily.  She continues to use albuterol on a regular basis 2-4 times per day.  She denies any nighttime awakenings with cough, wheezing or shortness of breath.  She reports significant environmental allergies worse in the spring and fall.  She does have cats at home and carpet flooring in the upstairs of her home and in her bedroom. ? ?She is using Pepcid 10 mg twice daily for history of GERD.  She reports it is well controlled at this time as she is avoiding trigger foods.  She does drink caffeine throughout the day. ? ?She is using fluticasone nasal spray 1 spray per nostril daily for allergies.  She is also taking Allegra daily.  She previously tried Singulair in the summer for 1 month and did not report much improvement in her symptoms. ? 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 ? ? ?Past Medical History:  ?Diagnosis Date  ? Asthma   ?  ? ?Family History  ?Problem Relation Age of Onset  ? Asthma Mother   ? Heart disease Father   ? Asthma Brother   ?  ? ?Social History  ? ?Socioeconomic History  ? Marital status: Married  ?  Spouse name: Not on file  ? Number of children: Not on file  ? Years of education: Not on file  ? Highest education level: Not on file  ?Occupational History  ? Not on file  ?Tobacco Use  ? Smoking status: Never  ? Smokeless tobacco: Never  ?Substance and Sexual Activity  ? Alcohol use: No  ? Drug use: No  ? Sexual activity: Never  ?Other Topics Concern  ? Not on file  ?Social History Narrative  ? Not on file  ? ?Social Determinants of Health  ? ?Financial Resource Strain: Not on file  ?Food Insecurity: Not on file  ?Transportation Needs: Not on file  ?Physical Activity: Not on file  ?Stress: Not on file  ?Social Connections: Not on file  ?Intimate Partner Violence: Not on file  ?  ? ?Allergies  ?Allergen Reactions  ? Latex   ? Nexium [Esomeprazole]   ?  ? ?Outpatient Medications Prior to Visit  ?Medication Sig Dispense Refill  ? albuterol (VENTOLIN HFA) 108 (90 Base) MCG/ACT inhaler Inhale  into the lungs.    ? B Complex Vitamins (VITAMIN B COMPLEX) TABS See admin instructions.    ? Cholecalciferol (VITAMIN D) 50 MCG (2000 UT) CAPS 1 capsule    ? famotidine (PEPCID) 10 MG tablet 1 tablet as needed    ? fexofenadine (ALLEGRA) 180 MG tablet Take 180 mg by mouth daily.    ? fluticasone (FLONASE) 50 MCG/ACT nasal spray 1 spray in each nostril    ? gabapentin (NEURONTIN) 100 MG capsule Take by mouth.    ? hydroxychloroquine (PLAQUENIL) 200 MG tablet 1 tablet    ? montelukast (SINGULAIR) 10 MG tablet Take 1 tablet (10 mg total) by mouth at bedtime. 30 tablet 11  ? sertraline (ZOLOFT) 100 MG tablet TAKE 1 & 1/2 TABLET BY MOUTH ONCE A DAY *PER DR, NEED APPT*    ? fluticasone furoate-vilanterol (BREO ELLIPTA) 200-25 MCG/ACT AEPB TAKE 1 PUFF  BY MOUTH EVERY DAY 60 each 3  ? albuterol (PROVENTIL) (5 MG/ML) 0.5% nebulizer solution Take 2.5 mg by nebulization every 6 (six) hours as needed.      ? ?No facility-administered medications prior to visit.  ? ? ?Review of Systems  ?Constitutional:  Negative for chills, fever, malaise/fatigue and weight loss.  ?HENT:  Negative for congestion, sinus pain and sore throat.   ?Eyes: Negative.   ?Respiratory:  Negative for cough, hemoptysis, sputum production, shortness of breath and wheezing.   ?Cardiovascular:  Negative for chest pain, palpitations, orthopnea, claudication and leg swelling.  ?Gastrointestinal:  Negative for abdominal pain, heartburn, nausea and vomiting.  ?Genitourinary: Negative.   ?Musculoskeletal:  Negative for joint pain and myalgias.  ?Skin:  Negative for rash.  ?Neurological:  Negative for weakness.  ?Endo/Heme/Allergies: Negative.   ?Psychiatric/Behavioral: Negative.    ? ?Objective:  ? ?Vitals:  ? 07/26/21 1158  ?BP: 126/82  ?Pulse: 98  ?SpO2: 98%  ?Weight: 221 lb 6.4 oz (100.4 kg)  ?Height: 5' 8.5" (1.74 m)  ? ?Physical Exam ?Constitutional:   ?   General: She is not in acute distress. ?   Appearance: She is not ill-appearing.  ?HENT:  ?   Head: Normocephalic and atraumatic.  ?Eyes:  ?   General: No scleral icterus. ?   Conjunctiva/sclera: Conjunctivae normal.  ?Cardiovascular:  ?   Rate and Rhythm: Normal rate and regular rhythm.  ?   Pulses: Normal pulses.  ?   Heart sounds: Normal heart sounds. No murmur heard. ?Pulmonary:  ?   Effort: Pulmonary effort is normal.  ?   Breath sounds: Normal breath sounds. No wheezing, rhonchi or rales.  ?Musculoskeletal:  ?   Right lower leg: No edema.  ?   Left lower leg: No edema.  ?Skin: ?   General: Skin is warm and dry.  ?Neurological:  ?   General: No focal deficit present.  ?   Mental Status: She is alert.  ?Psychiatric:     ?   Mood and Affect: Mood normal.     ?   Behavior: Behavior normal.     ?   Thought Content: Thought content normal.     ?    Judgment: Judgment normal.  ? ?CBC ?No results found for: WBC, RBC, HGB, HCT, PLT, MCV, MCH, MCHC, RDW, LYMPHSABS, MONOABS, EOSABS, BASOSABS ? ? ?Chest imaging: ? ?PFT: ? ?  Latest Ref Rng & Units 07/26/2021  ? 10:55 AM  ?PFT Results  ?FVC-Pre L 5.22    ?FVC-Predicted Pre % 120    ?FVC-Post L 5.24    ?  FVC-Predicted Post % 121    ?Pre FEV1/FVC % % 83    ?Post FEV1/FCV % % 83    ?FEV1-Pre L 4.35    ?FEV1-Predicted Pre % 121    ?FEV1-Post L 4.34    ?DLCO uncorrected ml/min/mmHg 30.91    ?DLCO UNC% % 119    ?DLCO corrected ml/min/mmHg 30.91    ?DLCO COR %Predicted % 119    ?DLVA Predicted % 105    ?TLC L 6.78    ?TLC % Predicted % 118    ?RV % Predicted % 88    ?PFTs 2023: Within normal limits ? ?Labs: ? ?Path: ? ?Echo: ? ?Heart Catheterization: ? ?Assessment & Plan:  ? ?Moderate persistent asthma without complication - Plan: fluticasone furoate-vilanterol (BREO ELLIPTA) 200-25 MCG/ACT AEPB ? ?Discussion: ?Ilona SorrelSarah Venturella is a 36 year old woman, never smoker who returns to pulmonary clinic for asthma.  ? ?She has moderate persistent asthma that is well controlled with Breo Ellipta 200-25 mcg 1 puff daily.  She has not needed her albuterol rescue inhaler as much since starting the [facility]Breo. ? ?She is to continue montelukast 10 mg daily along with Allegra and fluticasone nasal spray for her allergies. ? ?Her pulmonary function tests are within normal limits and these were reviewed with her today. ? ?We will consider de-escalating her Breo therapy to 100-25 mcg 1 puff daily at follow-up visit if she continues to do well. ? ?Follow-up in 6 months. ? ?Melody ComasJonathan Jeanee Fabre, MD ?[facility] ?Office: [phone] ? ? ?Current Outpatient Medications:  ?  albuterol (VENTOLIN HFA) 108 (90 Base) MCG/ACT inhaler, Inhale into the lungs., Disp: , Rfl:  ?  B Complex Vitamins (VITAMIN B COMPLEX) TABS, See admin instructions., Disp: , Rfl:  ?  Cholecalciferol (VITAMIN D) 50 MCG (2000 UT) CAPS, 1 capsule, Disp: , Rfl:  ?   famotidine (PEPCID) 10 MG tablet, 1 tablet as needed, Disp: , Rfl:  ?  fexofenadine (ALLEGRA) 180 MG tablet, Take 180 mg by mouth daily., Disp: , Rfl:  ?  fluticasone (FLONASE) 50 MCG/ACT nasal spray, 1 spra

## 2021-07-26 NOTE — Patient Instructions (Addendum)
Continue breo ellipta 1 puff daily for your asthma ? ?Your breathing tests are normal ? ?Continue montelukast and allegra daily for allergies ? ?Continue flonase daily ? ?Follow up in 6 months, we will consider decreasing your dose of breo to the 100-43mcg ?

## 2021-07-27 ENCOUNTER — Encounter: Payer: Self-pay | Admitting: Pulmonary Disease

## 2021-11-14 ENCOUNTER — Other Ambulatory Visit: Payer: Self-pay | Admitting: Pulmonary Disease

## 2021-11-14 DIAGNOSIS — J4540 Moderate persistent asthma, uncomplicated: Secondary | ICD-10-CM

## 2022-02-13 ENCOUNTER — Ambulatory Visit: Payer: No Typology Code available for payment source | Admitting: Pulmonary Disease

## 2022-03-06 ENCOUNTER — Encounter: Payer: Self-pay | Admitting: Pulmonary Disease

## 2022-03-06 ENCOUNTER — Ambulatory Visit: Payer: No Typology Code available for payment source | Admitting: Pulmonary Disease

## 2022-03-06 VITALS — BP 112/68 | HR 92 | Ht 68.5 in | Wt 236.4 lb

## 2022-03-06 DIAGNOSIS — J302 Other seasonal allergic rhinitis: Secondary | ICD-10-CM | POA: Diagnosis not present

## 2022-03-06 DIAGNOSIS — J4540 Moderate persistent asthma, uncomplicated: Secondary | ICD-10-CM

## 2022-03-06 MED ORDER — FLUTICASONE FUROATE-VILANTEROL 200-25 MCG/ACT IN AEPB: INHALATION_SPRAY | RESPIRATORY_TRACT | 11 refills | Status: DC

## 2022-03-06 NOTE — Patient Instructions (Addendum)
Continue zyrtec and Singulair daily for allergies  Continue breo ellipta 1 puff daily - rinse mouth out after each use  Consider using sinus rinses as needed for allergies  Follow up in 1 year

## 2022-03-06 NOTE — Progress Notes (Signed)
Synopsis: Referred in November 2022 for Asthma by Dahlia Byes, NP  Subjective:   PATIENT ID: Kelly Burke GENDER: female DOB: February 05, 1986, MRN: [id number]  HPI  Chief Complaint  Patient presents with   Follow-up    6 mo f/u. States her asthma has improved. She is only using her albuterol 2-3 times a week.    Kelly Burke is a 36 year old woman, never smoker who returns to pulmonary clinic for asthma.   She reports her asthma has been under good control with Breo 200-88mcg 1 puff daily. She switched to zyrtec from allegra. She is taking montelukast daily. She only had an increase in asthma symptoms during allergy seasons and needed albuterol rarely. She does not have night time awakenings. She denies issues with GERD. She does have post-nasal drainage.   She is followed by Dr. Dierdre Forth for inflammatory arthritis and is taking plaquenil.   OV 07/26/21 She has been doing well since last visit.  She reports improvement in her asthma symptoms since being started on Breo Ellipta 200-25 mcg 1 puff daily.  She reports rare use of albuterol inhaler.  She had flu a couple months ago where she had an increase in symptoms but these have returned back to normal.  She continues to use Flonase, Allegra and Singulair for allergies.  She denies any nighttime awakenings.  OV 03/20/21 She reports having asthma since 35 years old in which she only required and as needed albuterol inhaler.  Over this year she reports increasing shortness of breath, chest tightness and wheezing.  Her primary care team started her on inhaled corticosteroid therapy and recently increased her Flovent discus to 250 MCG 1 puff twice daily.  She continues to use albuterol on a regular basis 2-4 times per day.  She denies any nighttime awakenings with cough, wheezing or shortness of breath.  She reports significant environmental allergies worse in the spring and fall.  She does have cats at home and carpet flooring in the upstairs of  her home and in her bedroom.  She is using Pepcid 10 mg twice daily for history of GERD.  She reports it is well controlled at this time as she is avoiding trigger foods.  She does drink caffeine throughout the day.  She is using fluticasone nasal spray 1 spray per nostril daily for allergies.  She is also taking Allegra daily.  She previously tried Singulair in the summer for 1 month and did not report much improvement in her symptoms.  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   Past Medical History:  Diagnosis Date   Asthma      Family History  Problem Relation Age of Onset   Asthma Mother    Heart disease Father    Asthma Brother      Social History   Socioeconomic History   Marital status: Married    Spouse name: Not on file   Number of children: Not on file   Years of education: Not on file   Highest education level: Not on file  Occupational History   Not on file  Tobacco Use   Smoking status: Never   Smokeless tobacco: Never  Substance and Sexual Activity   Alcohol use: No   Drug use: No   Sexual activity: Never  Other Topics Concern   Not on file  Social History Narrative   Not on file   Social Determinants of Health   Financial Resource  Strain: Not on file  Food Insecurity: Not on file  Transportation Needs: Not on file  Physical Activity: Not on file  Stress: Not on file  Social Connections: Not on file  Intimate Partner Violence: Not on file     Allergies  Allergen Reactions   Latex    Nexium [Esomeprazole]      Outpatient Medications Prior to Visit  Medication Sig Dispense Refill   albuterol (VENTOLIN HFA) 108 (90 Base) MCG/ACT inhaler Inhale into the lungs.     B Complex Vitamins (VITAMIN B COMPLEX) TABS See admin instructions.     BREO ELLIPTA 200-25 MCG/ACT AEPB INHALE 1 PUFF BY MOUTH EVERY DAY 60 each 3   cetirizine (ZYRTEC) 10 MG tablet Take 10 mg by mouth daily.     Cholecalciferol (VITAMIN D) 50  MCG (2000 UT) CAPS 1 capsule     famotidine (PEPCID) 10 MG tablet 1 tablet as needed     fluticasone (FLONASE) 50 MCG/ACT nasal spray 1 spray in each nostril     gabapentin (NEURONTIN) 100 MG capsule Take by mouth.     hydroxychloroquine (PLAQUENIL) 200 MG tablet 1 tablet     montelukast (SINGULAIR) 10 MG tablet Take 1 tablet (10 mg total) by mouth at bedtime. 30 tablet 11   sertraline (ZOLOFT) 100 MG tablet TAKE 1 & 1/2 TABLET BY MOUTH ONCE A DAY *PER DR, NEED APPT*     fexofenadine (ALLEGRA) 180 MG tablet Take 180 mg by mouth daily.     No facility-administered medications prior to visit.    Review of Systems  Constitutional:  Negative for chills, fever, malaise/fatigue and weight loss.  HENT:  Negative for congestion, sinus pain and sore throat.   Eyes: Negative.   Respiratory:  Negative for cough, hemoptysis, sputum production, shortness of breath and wheezing.   Cardiovascular:  Negative for chest pain, palpitations, orthopnea, claudication and leg swelling.  Gastrointestinal:  Negative for abdominal pain, heartburn, nausea and vomiting.  Genitourinary: Negative.   Musculoskeletal:  Negative for joint pain and myalgias.  Skin:  Negative for rash.  Neurological:  Negative for weakness.  Endo/Heme/Allergies: Negative.   Psychiatric/Behavioral: Negative.      Objective:   Vitals:   03/06/22 1539  BP: 112/68  Pulse: 92  SpO2: 98%  Weight: 236 lb 6.4 oz (107.2 kg)  Height: 5' 8.5" (1.74 m)   Physical Exam Constitutional:      General: She is not in acute distress.    Appearance: She is not ill-appearing.  HENT:     Head: Normocephalic and atraumatic.  Eyes:     General: No scleral icterus.    Conjunctiva/sclera: Conjunctivae normal.  Cardiovascular:     Rate and Rhythm: Normal rate and regular rhythm.     Pulses: Normal pulses.     Heart sounds: Normal heart sounds. No murmur heard. Pulmonary:     Effort: Pulmonary effort is normal.     Breath sounds: Normal breath  sounds. No wheezing, rhonchi or rales.  Musculoskeletal:     Right lower leg: No edema.     Left lower leg: No edema.  Skin:    General: Skin is warm and dry.  Neurological:     General: No focal deficit present.     Mental Status: She is alert.  Psychiatric:        Mood and Affect: Mood normal.        Behavior: Behavior normal.        Thought Content: Thought content  normal.        Judgment: Judgment normal.    CBC No results found for: "WBC", "RBC", "HGB", "HCT", "PLT", "MCV", "MCH", "MCHC", "RDW", "LYMPHSABS", "MONOABS", "EOSABS", "BASOSABS"   Chest imaging:  PFT:    Latest Ref Rng & Units 07/26/2021   10:55 AM  PFT Results  FVC-Pre L 5.22   FVC-Predicted Pre % 120   FVC-Post L 5.24   FVC-Predicted Post % 121   Pre FEV1/FVC % % 83   Post FEV1/FCV % % 83   FEV1-Pre L 4.35   FEV1-Predicted Pre % 121   FEV1-Post L 4.34   DLCO uncorrected ml/min/mmHg 30.91   DLCO UNC% % 119   DLCO corrected ml/min/mmHg 30.91   DLCO COR %Predicted % 119   DLVA Predicted % 105   TLC L 6.78   TLC % Predicted % 118   RV % Predicted % 88   PFTs 2023: Within normal limits  Labs:  Path:  Echo:  Heart Catheterization:  Assessment & Plan:   Moderate persistent asthma without complication  Seasonal allergies  Discussion: Kelly Burke is a 36 year old woman, never smoker who returns to pulmonary clinic for asthma.   She has moderate persistent asthma that is well controlled with Breo Ellipta 200-25 mcg 1 puff daily. She is using albuterol inhaler as needed.  She is to continue montelukast 10 mg daily along with zyrtec and fluticasone nasal spray for her allergies.  Her pulmonary function tests are within normal limits.  Follow-up in 1 year.  Kelly Comas, MD [facility] Office: [phone]   Current Outpatient Medications:    albuterol (VENTOLIN HFA) 108 (90 Base) MCG/ACT inhaler, Inhale into the lungs., Disp: , Rfl:    B Complex Vitamins  (VITAMIN B COMPLEX) TABS, See admin instructions., Disp: , Rfl:    BREO ELLIPTA 200-25 MCG/ACT AEPB, INHALE 1 PUFF BY MOUTH EVERY DAY, Disp: 60 each, Rfl: 3   cetirizine (ZYRTEC) 10 MG tablet, Take 10 mg by mouth daily., Disp: , Rfl:    Cholecalciferol (VITAMIN D) 50 MCG (2000 UT) CAPS, 1 capsule, Disp: , Rfl:    famotidine (PEPCID) 10 MG tablet, 1 tablet as needed, Disp: , Rfl:    fluticasone (FLONASE) 50 MCG/ACT nasal spray, 1 spray in each nostril, Disp: , Rfl:    gabapentin (NEURONTIN) 100 MG capsule, Take by mouth., Disp: , Rfl:    hydroxychloroquine (PLAQUENIL) 200 MG tablet, 1 tablet, Disp: , Rfl:    montelukast (SINGULAIR) 10 MG tablet, Take 1 tablet (10 mg total) by mouth at bedtime., Disp: 30 tablet, Rfl: 11   sertraline (ZOLOFT) 100 MG tablet, TAKE 1 & 1/2 TABLET BY MOUTH ONCE A DAY *PER DR, NEED APPT*, Disp: , Rfl:

## 2022-03-09 ENCOUNTER — Other Ambulatory Visit: Payer: Self-pay | Admitting: Pulmonary Disease

## 2022-03-09 DIAGNOSIS — J4540 Moderate persistent asthma, uncomplicated: Secondary | ICD-10-CM

## 2022-03-14 ENCOUNTER — Encounter: Payer: Self-pay | Admitting: Pulmonary Disease

## 2023-01-20 LAB — LAB REPORT - SCANNED: EGFR: 107

## 2023-02-22 ENCOUNTER — Other Ambulatory Visit: Payer: Self-pay | Admitting: Pulmonary Disease

## 2023-02-22 DIAGNOSIS — J4540 Moderate persistent asthma, uncomplicated: Secondary | ICD-10-CM

## 2023-02-25 ENCOUNTER — Ambulatory Visit: Payer: No Typology Code available for payment source | Attending: Cardiology | Admitting: Cardiology

## 2023-02-25 ENCOUNTER — Ambulatory Visit (INDEPENDENT_AMBULATORY_CARE_PROVIDER_SITE_OTHER): Payer: No Typology Code available for payment source

## 2023-02-25 ENCOUNTER — Telehealth: Payer: Self-pay | Admitting: Pulmonary Disease

## 2023-02-25 ENCOUNTER — Encounter: Payer: Self-pay | Admitting: Cardiology

## 2023-02-25 VITALS — BP 136/88 | HR 88 | Ht 69.0 in | Wt 231.0 lb

## 2023-02-25 DIAGNOSIS — R42 Dizziness and giddiness: Secondary | ICD-10-CM

## 2023-02-25 DIAGNOSIS — M359 Systemic involvement of connective tissue, unspecified: Secondary | ICD-10-CM | POA: Insufficient documentation

## 2023-02-25 DIAGNOSIS — J4540 Moderate persistent asthma, uncomplicated: Secondary | ICD-10-CM

## 2023-02-25 NOTE — Progress Notes (Addendum)
Cardiology CONSULT Note    Date:  02/25/2023   ID:  Kelly Burke, DOB 05-05-1985, MRN [id number]  PCP:  Janace Aris, NP  Cardiologist:  Armanda Magic, MD   Chief Complaint  Patient presents with   New Patient (Initial Visit)    Dizziness    Patient Profile: Kelly Burke is a 37 y.o. female who is being seen today for the evaluation of Dizziness at the request of Inez Pilgrim, NP.  History of Present Illness:  Kelly Burke is a 37 y.o. female who is being seen today for the evaluation of dizziness at the request of Pridgen, Stasia Cavalier, NP.  This is a 37yo female with a hx of asthma and unspecified autoimmune disorder that she sees a rheumatologist for who is referred for evaluation of dizziness.   She tells me that she has had problems with dizziness when getting out of bed starting when she was 37yo.  She was encouraged to increase Na intake in her diet which helped.  About a year or 2 ago after having the flu she started having orthostatic problems. If she stood up too fast, stood for too long or in the shower the sx became more frequent.  She tried taking electrolytes as well as wearing compression hose.  She mainly notices now that she gets in the shower and has started to try to wash her hair in the sink to avoid too much time in the shower.  She drinks around 200-300oz of fluid daily which she adds electrolyte to.  She was diagnosed several years ago with an unspecified autoimmune disorder (inflammatory arthritis) that she sees Rheum for.  She is on Plaquenil.   She has some problems with SOB but no CP.  The SOB comes on mainly when going up stairs or if she bends down to pick up something and stands up too fast. She denies any LE edema or syncope.  She does have palpitations at times as well.  She says that the dizziness and palpitations usually occur around the same time frame if she has been standing for a long time work walking for a while. She has never smoked.  Her  father had a massive MI in his 56's.    Past Medical History:  Diagnosis Date   Asthma    Autoimmune disease (HCC)     History reviewed. No pertinent surgical history.  Current Medications: Current Meds  Medication Sig   albuterol (VENTOLIN HFA) 108 (90 Base) MCG/ACT inhaler Inhale into the lungs.   B Complex Vitamins (VITAMIN B COMPLEX) TABS See admin instructions.   Bromelain 250 MG CAPS    cetirizine (ZYRTEC) 10 MG tablet Take 10 mg by mouth daily.   Cholecalciferol (VITAMIN D) 50 MCG (2000 UT) CAPS 1 capsule   famotidine (PEPCID) 10 MG tablet 1 tablet as needed   fluticasone (FLONASE) 50 MCG/ACT nasal spray 1 spray in each nostril   fluticasone furoate-vilanterol (BREO ELLIPTA) 200-25 MCG/ACT AEPB INHALE 1 PUFF BY MOUTH EVERY DAY   gabapentin (NEURONTIN) 100 MG capsule Take by mouth.   hydroxychloroquine (PLAQUENIL) 200 MG tablet 1 tablet   Melatonin 1 MG CHEW    montelukast (SINGULAIR) 10 MG tablet TAKE 1 TABLET BY MOUTH EVERYDAY AT BEDTIME   naproxen sodium (ALEVE) 220 MG tablet 1 tablet with food or milk as needed Orally every 12 hrs   polyethylene glycol (MIRALAX) 17 g packet 1 packet mixed with 8 ounces of fluid Orally Once  a day for 30 day(s)   Quercetin 50 MG TABS Take 120 Orally Once a day   saccharomyces boulardii (FLORASTOR) 250 MG capsule 1 capsule Orally Twice a day for 30 day(s)   sertraline (ZOLOFT) 100 MG tablet TAKE 1 & 1/2 TABLET BY MOUTH ONCE A DAY *PER DR, NEED APPT*   Turmeric 08-998 MG CAPS     Allergies:   Latex and Nexium [esomeprazole]   Social History   Socioeconomic History   Marital status: Married    Spouse name: Not on file   Number of children: Not on file   Years of education: Not on file   Highest education level: Not on file  Occupational History   Not on file  Tobacco Use   Smoking status: Never   Smokeless tobacco: Never  Substance and Sexual Activity   Alcohol use: No   Drug use: No   Sexual activity: Never  Other Topics  Concern   Not on file  Social History Narrative   Not on file   Social Determinants of Health   Financial Resource Strain: Not on file  Food Insecurity: Not on file  Transportation Needs: Not on file  Physical Activity: Not on file  Stress: Not on file  Social Connections: Not on file     Family History:  The patient's family history includes Asthma in her brother and mother; Heart disease in her father.   ROS:   Please see the history of present illness.    ROS All other systems reviewed and are negative.      No data to display             PHYSICAL EXAM:   Orthostatic VS for the past 24 hrs (Last 3 readings):  BP- Lying Pulse- Lying BP- Sitting Pulse- Sitting BP- Standing at 0 minutes Pulse- Standing at 0 minutes BP- Standing at 3 minutes Pulse- Standing at 3 minutes  02/25/23 1304 136/88 88 123/82 97 125/86 95 124/87 106     VS:  BP 136/88   Pulse 88   Ht 5\' 9"  (1.753 m)   Wt 231 lb (104.8 kg)   SpO2 97%   BMI 34.11 kg/m    GEN: Well nourished, well developed, in no acute distress  HEENT: normal  Neck: no JVD, carotid bruits, or masses Cardiac: RRR; no murmurs, rubs, or gallops,no edema.  Intact distal pulses bilaterally.  Respiratory:  clear to auscultation bilaterally, normal work of breathing GI: soft, nontender, nondistended, + BS MS: no deformity or atrophy  Skin: warm and dry, no rash Neuro:  Alert and Oriented x 3, Strength and sensation are intact Psych: euthymic mood, full affect  Wt Readings from Last 3 Encounters:  02/25/23 231 lb (104.8 kg)  03/06/22 236 lb 6.4 oz (107.2 kg)  07/26/21 221 lb 6.4 oz (100.4 kg)      Studies/Labs Reviewed:   EKG Interpretation Date/Time:  Wednesday February 25 2023 13:01:54 EST Ventricular Rate:  85 PR Interval:  154 QRS Duration:  84 QT Interval:  370 QTC Calculation: 440 R Axis:   77  Text Interpretation: Normal sinus rhythm with sinus arrhythmia Normal ECG No previous ECGs available Confirmed by  Armanda Magic ([id number]) on 02/25/2023 1:15:59 PM       Recent Labs: No results found for requested labs within last 365 days.   Lipid Panel No results found for: "CHOL", "TRIG", "HDL", "CHOLHDL", "VLDL", "LDLCALC", "LDLDIRECT"   ASSESSMENT:    1. Postural dizziness   2.  Autoimmune disease (HCC)      PLAN:  In order of problems listed above:  Dizziness/Palpitations -Symptoms suspicious for POTS ? From prior flu a few years ago with autonomic dysfunction -I have personally reviewed and interpreted outside labs performed by patient's PCP which showed SCr 0.75, K+ 4.2 on 01/19/23 and TSH 2.54 on 10/07/2022 -check 2D echo to assess LVF -check am cortisol level to rule out adrenal insufficiency given her autoimmune disease -UA to check urine specific gravity -check a 2 week ziopatch to rule out arrhythmia -do not use caffeine -encouraged her to use compression hose>>will give her a Rx for these -encouraged her to try to start an exercise program  2.  Family hx of premature CAD -her father had an MI in his 76's -her father was a smoker -she has never smoked and her lipids are fine  Time Spent: 20 minutes total time of encounter, including 15 minutes spent in face-to-face patient care on the date of this encounter. This time includes coordination of care and counseling regarding above mentioned problem list. Remainder of non-face-to-face time involved reviewing chart documents/testing relevant to the patient encounter and documentation in the medical record. I have independently reviewed documentation from referring provider  Followup:  2 months   Medication Adjustments/Labs and Tests Ordered: Current medicines are reviewed at length with the patient today.  Concerns regarding medicines are outlined above.  Medication changes, Labs and Tests ordered today are listed in the Patient Instructions below.  There are no Patient Instructions on file for this visit.   Signed, Armanda Magic, MD  02/25/2023 1:34 PM    [facility] [address] [place], [place], [place]  [address] Phone: [phone]; Fax: [phone]

## 2023-02-25 NOTE — Addendum Note (Signed)
Addended by: Luellen Pucker on: 02/25/2023 01:59 PM   Modules accepted: Orders

## 2023-02-25 NOTE — Addendum Note (Signed)
Addended by: Luellen Pucker on: 02/25/2023 02:14 PM   Modules accepted: Orders

## 2023-02-25 NOTE — Progress Notes (Unsigned)
Enrolled patient for a 14 day Zio XT  monitor to be mailed to patients home  °

## 2023-02-25 NOTE — Patient Instructions (Signed)
Medication Instructions:  Your physician recommends that you continue on your current medications as directed. Please refer to the Current Medication list given to you today.  *If you need a refill on your cardiac medications before your next appointment, please call your pharmacy*   Lab Work: Please complete an 8 AM cortisol level and a urinalysis at your earliest convenience.  If you have labs (blood work) drawn today and your tests are completely normal, you will receive your results only by: MyChart Message (if you have MyChart) OR A paper copy in the mail If you have any lab test that is abnormal or we need to change your treatment, we will call you to review the results.   Testing/Procedures: Your physician has requested that you have an echocardiogram. Echocardiography is a painless test that uses sound waves to create images of your heart. It provides your doctor with information about the size and shape of your heart and how well your heart's chambers and valves are working. This procedure takes approximately one hour. There are no restrictions for this procedure. Please do NOT wear cologne, perfume, aftershave, or lotions (deodorant is allowed). Please arrive 15 minutes prior to your appointment time.  Please note: We ask at that you not bring children with you during ultrasound (echo/ vascular) testing. Due to room size and safety concerns, children are not allowed in the ultrasound rooms during exams. Our front office staff cannot provide observation of children in our lobby area while testing is being conducted. An adult accompanying a patient to their appointment will only be allowed in the ultrasound room at the discretion of the ultrasound technician under special circumstances. We apologize for any inconvenience.  ZIO XT- Long Term Monitor Instructions  Your physician has requested you wear a ZIO patch monitor for 14 days.  This is a single patch monitor. [employer] supplies  one patch monitor per enrollment. Additional stickers are not available. Please do not apply patch if you will be having a Nuclear Stress Test,  Echocardiogram, Cardiac CT, MRI, or Chest Xray during the period you would be wearing the  monitor. The patch cannot be worn during these tests. You cannot remove and re-apply the  ZIO XT patch monitor.  Your ZIO patch monitor will be mailed 3 day USPS to your address on file. It may take 3-5 days  to receive your monitor after you have been enrolled.  Once you have received your monitor, please review the enclosed instructions. Your monitor  has already been registered assigning a specific monitor serial # to you.  Billing and Patient Assistance Program Information  We have supplied [employer] with any of your insurance information on file for billing purposes. [employer] offers a sliding scale Patient Assistance Program for patients that do not have  insurance, or whose insurance does not completely cover the cost of the ZIO monitor.  You must apply for the Patient Assistance Program to qualify for this discounted rate.  To apply, please call [employer] at [phone], select option 4, select option 2, ask to apply for  Patient Assistance Program. Meredeth Ide will ask your household income, and how many people  are in your household. They will quote your out-of-pocket cost based on that information.  [employer] will also be able to set up a 52-month, interest-free payment plan if needed.  Applying the monitor   Shave hair from upper left chest.  Hold abrader disc by orange tab. Rub abrader in 40 strokes over the upper left chest as  indicated in your monitor instructions.  Clean area with 4 enclosed alcohol pads. Let dry.  Apply patch as indicated in monitor instructions. Patch will be placed under collarbone on left  side of chest with arrow pointing upward.  Rub patch adhesive wings for 2 minutes. Remove white label marked "1". Remove the white  label  marked "2". Rub patch adhesive wings for 2 additional minutes.  While looking in a mirror, press and release button in center of patch. A small green light will  flash 3-4 times. This will be your only indicator that the monitor has been turned on.  Do not shower for the first 24 hours. You may shower after the first 24 hours.  Press the button if you feel a symptom. You will hear a small click. Record Date, Time and  Symptom in the Patient Logbook.  When you are ready to remove the patch, follow instructions on the last 2 pages of Patient  Logbook. Stick patch monitor onto the last page of Patient Logbook.  Place Patient Logbook in the blue and white box. Use locking tab on box and tape box closed  securely. The blue and white box has prepaid postage on it. Please place it in the mailbox as  soon as possible. Your physician should have your test results approximately 7 days after the  monitor has been mailed back to [facility].  Call [facility] Customer Care at [phone] if you have questions regarding  your ZIO XT patch monitor. Call them immediately if you see an orange light blinking on your  monitor.  If your monitor falls off in less than 4 days, contact our Monitor department at [phone].  If your monitor becomes loose or falls off after 4 days call [employer] at [phone] for  suggestions on securing your monitor    Follow-Up: At [facility], you and your health needs are our priority.  As part of our continuing mission to provide you with exceptional heart care, we have created designated Provider Care Teams.  These Care Teams include your primary Cardiologist (physician) and Advanced Practice Providers (APPs -  Physician Assistants and Nurse Practitioners) who all work together to provide you with the care you need, when you need it.  We recommend signing up for the patient portal called "MyChart".  Sign up information is provided on this After Visit  Summary.  MyChart is used to connect with patients for Virtual Visits (Telemedicine).  Patients are able to view lab/test results, encounter notes, upcoming appointments, etc.  Non-urgent messages can be sent to your provider as well.   To learn more about what you can do with MyChart, go to [email or web address].    Your next appointment:   2 month(s)  Provider:   Dr. Armanda Magic, MD   Other Instructions Please obtain compression hose with the script provided.

## 2023-02-25 NOTE — Telephone Encounter (Signed)
Needs Breo approved for pharm. PT is just on the cusp of it being one year but has made appt. Please call to advise @ [phone]   Pharm is [facility] @ [address]

## 2023-02-26 ENCOUNTER — Telehealth: Payer: Self-pay

## 2023-02-26 LAB — SPECIFIC GRAVITY, URINE: Specific Gravity, UA: 1.009 (ref 1.005–1.030)

## 2023-02-26 NOTE — Telephone Encounter (Signed)
-----   Message from Armanda Magic sent at 02/26/2023  8:14 AM EST ----- Urine specific gravity in the low end of normal consistent with increased fluid intake and not dehydration

## 2023-02-26 NOTE — Telephone Encounter (Signed)
Call to patient to discuss results, patient verbalizes understanding of urine specific gravity on the low end of normal which is consistent with increased fluid intake and not dehydration.

## 2023-02-27 ENCOUNTER — Other Ambulatory Visit: Payer: Self-pay | Admitting: Pulmonary Disease

## 2023-02-27 DIAGNOSIS — R42 Dizziness and giddiness: Secondary | ICD-10-CM | POA: Diagnosis not present

## 2023-02-27 DIAGNOSIS — J4540 Moderate persistent asthma, uncomplicated: Secondary | ICD-10-CM

## 2023-03-02 ENCOUNTER — Encounter: Payer: Self-pay | Admitting: *Deleted

## 2023-03-02 MED ORDER — FLUTICASONE FUROATE-VILANTEROL 200-25 MCG/ACT IN AEPB: 1.0000 | INHALATION_SPRAY | Freq: Every day | RESPIRATORY_TRACT | 0 refills | Status: DC

## 2023-03-03 ENCOUNTER — Other Ambulatory Visit (HOSPITAL_COMMUNITY): Payer: Self-pay

## 2023-03-05 LAB — CORTISOL, URINE, FREE
Cortisol (Ur), Free: 20 ug/(24.h) (ref 6–42)
Cortisol,F,ug/L,U: 8 ug/L

## 2023-03-09 ENCOUNTER — Telehealth: Payer: Self-pay

## 2023-03-09 NOTE — Telephone Encounter (Signed)
-----   Message from Armanda Magic sent at 03/05/2023  5:46 PM EST ----- This was supposed to be an 8am serum cortisol level not urine

## 2023-03-09 NOTE — Telephone Encounter (Signed)
Call to patient, no answer, left detailed message per DPR asking patient to call our office  to discuss blood work.

## 2023-03-12 ENCOUNTER — Telehealth: Payer: Self-pay | Admitting: Cardiology

## 2023-03-12 NOTE — Telephone Encounter (Signed)
Spoke with patient and she will come in next Wednesday to have blood drawn.

## 2023-03-12 NOTE — Telephone Encounter (Signed)
Patient is returning RN's call for her urine sample results. She is also requesting we request records for recent labs she had performed elsewhere.   Please advise.

## 2023-03-18 ENCOUNTER — Other Ambulatory Visit: Payer: Self-pay

## 2023-03-18 ENCOUNTER — Telehealth: Payer: Self-pay | Admitting: Pharmacist

## 2023-03-18 ENCOUNTER — Telehealth: Payer: Self-pay

## 2023-03-18 ENCOUNTER — Other Ambulatory Visit (HOSPITAL_BASED_OUTPATIENT_CLINIC_OR_DEPARTMENT_OTHER): Payer: Self-pay | Admitting: Cardiology

## 2023-03-18 DIAGNOSIS — R42 Dizziness and giddiness: Secondary | ICD-10-CM

## 2023-03-18 NOTE — Telephone Encounter (Signed)
Patient showed up to have lab work - no orders in at that time - reviewed Dr Norris Cross note, placed order for cortisol AM blood level. No further needs

## 2023-03-18 NOTE — Telephone Encounter (Signed)
Code called from lab at 0832. Patient was having AM cortisol labs drawn and started feeling dizzy. She has a hx of passsing out with lab draws. Her legs were raised up. Dr. Graciela Husbands (DOD) and team responded to code. BP was 104/70, BG 91. Repeat BP about 4 min later was 104/72. BP was taken radially.  Patient was given crackers and gingerale. She felt better and was able to leave on her own.

## 2023-03-20 LAB — CORTISOL-AM, BLOOD: Cortisol - AM: 11.4 ug/dL (ref 6.2–19.4)

## 2023-03-27 ENCOUNTER — Telehealth: Payer: Self-pay

## 2023-03-27 DIAGNOSIS — Z79899 Other long term (current) drug therapy: Secondary | ICD-10-CM

## 2023-03-27 DIAGNOSIS — R9431 Abnormal electrocardiogram [ECG] [EKG]: Secondary | ICD-10-CM

## 2023-03-27 NOTE — Telephone Encounter (Signed)
Call to patient to discuss heart monitor results. No answer, left detailed message explaining that heart monitor showed  rare extra heart beats from the top and bottom of the heart which are completely benign and do not need treatment except get a 2D echo to make sure LVF is normal               Advised patient I would order an echo and provided patient instructions. Advised someone would all to get her scheduled. Also advised Dr. Mayford Knife recommends BMET, TSH and Mag, placed and released orders. Advised patient she can go to any [facility] to complete. Asked patient to call our office if any questions.

## 2023-03-27 NOTE — Telephone Encounter (Signed)
-----   Message from Armanda Magic sent at 03/26/2023 11:15 AM EST ----- Heart monitor showed rare extra heart beats from the top and bottom of the heart which are completely benign and do not need treatment except get a 2D echo to make sure LVF is normal

## 2023-03-30 ENCOUNTER — Ambulatory Visit (HOSPITAL_COMMUNITY): Payer: No Typology Code available for payment source | Attending: Cardiology

## 2023-03-30 DIAGNOSIS — R42 Dizziness and giddiness: Secondary | ICD-10-CM | POA: Diagnosis present

## 2023-03-30 LAB — ECHOCARDIOGRAM COMPLETE
Area-P 1/2: 5.02 cm2
S' Lateral: 2.7 cm

## 2023-03-31 NOTE — Telephone Encounter (Signed)
Echo department alerting that patient has duplicate order for Echo. First order placed 02/25/23, echo ordered again 03/27/23, but then echo completed 03/30/23. Echo order from 03/27/23 deleted.

## 2023-04-09 ENCOUNTER — Telehealth: Payer: Self-pay

## 2023-04-09 DIAGNOSIS — I491 Atrial premature depolarization: Secondary | ICD-10-CM

## 2023-04-09 DIAGNOSIS — Z79899 Other long term (current) drug therapy: Secondary | ICD-10-CM

## 2023-04-09 DIAGNOSIS — R42 Dizziness and giddiness: Secondary | ICD-10-CM

## 2023-04-09 DIAGNOSIS — R9431 Abnormal electrocardiogram [ECG] [EKG]: Secondary | ICD-10-CM

## 2023-04-09 NOTE — Telephone Encounter (Signed)
-----   Message from Armanda Magic sent at 04/01/2023  9:33 AM EST ----- Normal echo

## 2023-04-09 NOTE — Telephone Encounter (Signed)
Call to patient to explain echo is normal, patient verbalizes understanding.   Patient requests that lab orders placed to be completed tomorrow ( 04/10/23) be replaced as it will be next week before she can complete. Lab orders (TSH, Mag, BMET) reordered and released.

## 2023-04-15 LAB — BASIC METABOLIC PANEL
BUN/Creatinine Ratio: 13 (ref 9–23)
BUN: 9 mg/dL (ref 6–20)
CO2: 19 mmol/L — ABNORMAL LOW (ref 20–29)
Calcium: 9.3 mg/dL (ref 8.7–10.2)
Chloride: 102 mmol/L (ref 96–106)
Creatinine, Ser: 0.67 mg/dL (ref 0.57–1.00)
Glucose: 76 mg/dL (ref 70–99)
Potassium: 4.3 mmol/L (ref 3.5–5.2)
Sodium: 137 mmol/L (ref 134–144)
eGFR: 117 mL/min/{1.73_m2} (ref >59)

## 2023-04-15 LAB — MAGNESIUM: Magnesium: 2 mg/dL (ref 1.6–2.3)

## 2023-04-15 LAB — TSH: TSH: 2.99 u[IU]/mL (ref 0.450–4.500)

## 2023-04-16 ENCOUNTER — Telehealth: Payer: Self-pay

## 2023-04-16 NOTE — Telephone Encounter (Signed)
-----   Message from Armanda Magic sent at 04/16/2023  9:53 AM EST ----- Stable labs - continue current meds and forward to PCP

## 2023-04-16 NOTE — Telephone Encounter (Signed)
Call to patient to advise of normal labs, patient verbalizes understanding. Patient notified labs would be sent to PCP.

## 2023-04-24 ENCOUNTER — Other Ambulatory Visit: Payer: Self-pay | Admitting: Pulmonary Disease

## 2023-04-24 DIAGNOSIS — J4540 Moderate persistent asthma, uncomplicated: Secondary | ICD-10-CM

## 2023-04-27 NOTE — Telephone Encounter (Signed)
 Pt must have f/u for further refills

## 2023-04-28 ENCOUNTER — Ambulatory Visit: Payer: No Typology Code available for payment source | Attending: Cardiology | Admitting: Cardiology

## 2023-04-28 ENCOUNTER — Encounter: Payer: Self-pay | Admitting: Cardiology

## 2023-04-28 VITALS — BP 134/84 | HR 80 | Ht 69.0 in | Wt 230.6 lb

## 2023-04-28 DIAGNOSIS — R42 Dizziness and giddiness: Secondary | ICD-10-CM | POA: Diagnosis not present

## 2023-04-28 DIAGNOSIS — G90A Postural orthostatic tachycardia syndrome (POTS): Secondary | ICD-10-CM

## 2023-04-28 DIAGNOSIS — I491 Atrial premature depolarization: Secondary | ICD-10-CM

## 2023-04-28 DIAGNOSIS — I493 Ventricular premature depolarization: Secondary | ICD-10-CM

## 2023-04-28 NOTE — Progress Notes (Signed)
 Cardiology Note    Date:  04/28/2023   ID:  Kelly Burke, DOB Jul 28, 1985, MRN [id number]  PCP:  Adah Wilbert LABOR, NP  Cardiologist:  Wilbert Bihari, MD   Chief Complaint  Patient presents with   Follow-up    Dizziness   History of Present Illness:  Kelly Burke is a 38 y.o. female with a hx of asthma and unspecified autoimmune disorder that she sees a rheumatologist for who he was referred for evaluation of dizziness a few months ago.    She has had problems with dizziness when getting out of bed starting when she was 38yo.  She was encouraged to increase Na intake in her diet which helped.  About a year or 2 ago after having the flu she started having orthostatic problems. If she stood up too fast, stood for too long or in the shower the sx became more frequent.  She tried taking electrolytes as well as wearing compression hose.  She mainly notices now that she gets in the shower and has started to try to wash her hair in the sink to avoid too much time in the shower.  She drinks around 200-300oz of fluid daily which she adds electrolyte to.  She was diagnosed several years ago with an unspecified autoimmune disorder (inflammatory arthritis) that she sees Rheum for.  She is on Plaquenil.   2D echo was done which showed an EF of 55 to 60% with normal GLS and normal diastolic function and no valvular disease.  Heart monitor showed rare PACs and PVCs.  A.m. cortisol was normal.  Serum creatinine, TSH and electrolytes were normal.  She was encouraged to avoid caffeine and use compression hose during the day and start an exercise program.  She is now here for follow-up.  She is here today for followup and is doing well.  Her dizziness has improved while wearing the compression hose and taking Na tablets.  She says that carbs can trigger her sx as well and she has cut back on Carbs and has lost 5 lbs.  She has not had any syncope.  She denies any chest pain or pressure, PND, orthopnea, LE edema,  dizziness or syncope. She still has some DOE when she bends over and stands up quickly several times in a row to pet a dog or going up steps. She still has some palpitations but has improved.  She has not had as much problem with dizziness when she showers.  She is compliant with her meds and is tolerating meds with no SE.    Past Medical History:  Diagnosis Date   Asthma    Autoimmune disease (HCC)    Postural orthostatic tachycardia syndrome (POTS)     History reviewed. No pertinent surgical history.  Current Medications: Current Meds  Medication Sig   albuterol (VENTOLIN HFA) 108 (90 Base) MCG/ACT inhaler Inhale into the lungs.   B Complex Vitamins (VITAMIN B COMPLEX) TABS See admin instructions.   Bromelain 250 MG CAPS    cetirizine (ZYRTEC) 10 MG tablet Take 10 mg by mouth daily.   Cholecalciferol (VITAMIN D) 50 MCG (2000 UT) CAPS 1 capsule   famotidine (PEPCID) 10 MG tablet 1 tablet as needed   fluticasone  (FLONASE) 50 MCG/ACT nasal spray 1 spray in each nostril   fluticasone  furoate-vilanterol (BREO ELLIPTA ) 200-25 MCG/ACT AEPB INHALE 1 PUFF BY MOUTH EVERY DAY **NEED OFFICE VISIT   gabapentin (NEURONTIN) 100 MG capsule Take by mouth.   hydroxychloroquine (  PLAQUENIL) 200 MG tablet 1 tablet   Melatonin 1 MG CHEW    montelukast  (SINGULAIR ) 10 MG tablet TAKE 1 TABLET BY MOUTH EVERYDAY AT BEDTIME   naproxen sodium (ALEVE) 220 MG tablet 1 tablet with food or milk as needed Orally every 12 hrs   polyethylene glycol (MIRALAX) 17 g packet 1 packet mixed with 8 ounces of fluid Orally Once a day for 30 day(s)   Quercetin 50 MG TABS Take 120 Orally Once a day   saccharomyces boulardii (FLORASTOR) 250 MG capsule 1 capsule Orally Twice a day for 30 day(s)   sertraline (ZOLOFT) 100 MG tablet TAKE 1 & 1/2 TABLET BY MOUTH ONCE A DAY *PER DR, NEED APPT*   Turmeric 08-998 MG CAPS     Allergies:   Latex and Nexium [esomeprazole]   Social History   Socioeconomic History   Marital status:  Married    Spouse name: Not on file   Number of children: Not on file   Years of education: Not on file   Highest education level: Not on file  Occupational History   Not on file  Tobacco Use   Smoking status: Never   Smokeless tobacco: Never  Substance and Sexual Activity   Alcohol use: No   Drug use: No   Sexual activity: Never  Other Topics Concern   Not on file  Social History Narrative   Not on file   Social Drivers of Health   Financial Resource Strain: Not on file  Food Insecurity: Not on file  Transportation Needs: Not on file  Physical Activity: Not on file  Stress: Not on file  Social Connections: Not on file     Family History:  The patient's family history includes Asthma in her brother and mother; Heart disease in her father.   ROS:   Please see the history of present illness.    ROS All other systems reviewed and are negative.      No data to display             PHYSICAL EXAM:   No data found.    VS:  BP 134/84   Pulse 80   Ht 5' 9 (1.753 m)   Wt 230 lb 9.6 oz (104.6 kg)   SpO2 98%   BMI 34.05 kg/m    GEN: Well nourished, well developed in no acute distress HEENT: Normal NECK: No JVD; No carotid bruits LYMPHATICS: No lymphadenopathy CARDIAC:RRR, no murmurs, rubs, gallops RESPIRATORY:  Clear to auscultation without rales, wheezing or rhonchi  ABDOMEN: Soft, non-tender, non-distended MUSCULOSKELETAL:  No edema; No deformity  SKIN: Warm and dry NEUROLOGIC:  Alert and oriented x 3 PSYCHIATRIC:  Normal affect   Wt Readings from Last 3 Encounters:  04/28/23 230 lb 9.6 oz (104.6 kg)  02/25/23 231 lb (104.8 kg)  03/06/22 236 lb 6.4 oz (107.2 kg)      Studies/Labs Reviewed:          Recent Labs: 04/14/2023: BUN 9; Creatinine, Ser 0.67; Magnesium 2.0; Potassium 4.3; Sodium 137; TSH 2.990   Lipid Panel No results found for: CHOL, TRIG, HDL, CHOLHDL, VLDL, LDLCALC, LDLDIRECT   ASSESSMENT:    1. Postural  dizziness   2. PAC (premature atrial contraction)   3. PVC (premature ventricular contraction)   4. Postural orthostatic tachycardia syndrome (POTS)       PLAN:  In order of problems listed above:  Dizziness/PACs/PVCs -Symptoms suspicious for POTS ? From prior flu a few years ago with autonomic  dysfunction -Labs were normal from PCP which showed SCr 0.75, K+ 4.2 on 01/19/23 and TSH 2.54 on 10/07/2022 -2D echo completely normal -am result level was normal -2-week Zio patch showed rare PACs and PVCs -At last office visit I encouraged her to not use caffeine, start an exercise program and use compression hose -She is feeling better after starting compression hose and Na tablets and has not had any syncope.  She only gets dizzy if she bends over and stands up a few times in a row -still has some palpitations but they are improved -she has started walking and I encouraged her to try to increase ultimately to 30-45 minutes daily at least 5 days weekly  2.  Family hx of premature CAD -her father had an MI in his 92's -her father was a smoker -she has never smoked and her lipids are fine -She has no anginal symptoms  Time Spent: 20 minutes total time of encounter, including 15 minutes spent in face-to-face patient care on the date of this encounter. This time includes coordination of care and counseling regarding above mentioned problem list. Remainder of non-face-to-face time involved reviewing chart documents/testing relevant to the patient encounter and documentation in the medical record. I have independently reviewed documentation from referring provider  Followup: 1 year   Medication Adjustments/Labs and Tests Ordered: Current medicines are reviewed at length with the patient today.  Concerns regarding medicines are outlined above.  Medication changes, Labs and Tests ordered today are listed in the Patient Instructions below.  There are no Patient Instructions on file for this  visit.   Signed, Wilbert Bihari, MD  04/28/2023 1:52 PM    [facility] [address] [place], [place], [place]  [address] Phone: [phone]; Fax: [phone]

## 2023-04-28 NOTE — Patient Instructions (Signed)
 Medication Instructions:  Your physician recommends that you continue on your current medications as directed. Please refer to the Current Medication list given to you today.  *If you need a refill on your cardiac medications before your next appointment, please call your pharmacy*   Lab Work: None.  If you have labs (blood work) drawn today and your tests are completely normal, you will receive your results only by: MyChart Message (if you have MyChart) OR A paper copy in the mail If you have any lab test that is abnormal or we need to change your treatment, we will call you to review the results.   Testing/Procedures: None.   Follow-Up:  Your next appointment:   1 year(s)  Provider:   Dr. Armanda Magic, MD

## 2023-05-12 ENCOUNTER — Ambulatory Visit: Payer: No Typology Code available for payment source | Admitting: Pulmonary Disease

## 2023-05-28 ENCOUNTER — Other Ambulatory Visit: Payer: Self-pay | Admitting: Pulmonary Disease

## 2023-05-28 DIAGNOSIS — J4540 Moderate persistent asthma, uncomplicated: Secondary | ICD-10-CM

## 2023-05-29 ENCOUNTER — Other Ambulatory Visit: Payer: Self-pay | Admitting: Pulmonary Disease

## 2023-05-29 DIAGNOSIS — J4540 Moderate persistent asthma, uncomplicated: Secondary | ICD-10-CM

## 2023-06-01 MED ORDER — FLUTICASONE FUROATE-VILANTEROL 200-25 MCG/ACT IN AEPB: 1.0000 | INHALATION_SPRAY | Freq: Every day | RESPIRATORY_TRACT | 0 refills | Status: DC

## 2023-06-15 ENCOUNTER — Other Ambulatory Visit: Payer: Self-pay | Admitting: Pulmonary Disease

## 2023-06-15 DIAGNOSIS — J4540 Moderate persistent asthma, uncomplicated: Secondary | ICD-10-CM

## 2023-06-21 ENCOUNTER — Other Ambulatory Visit: Payer: Self-pay | Admitting: Pulmonary Disease

## 2023-06-21 DIAGNOSIS — J4540 Moderate persistent asthma, uncomplicated: Secondary | ICD-10-CM

## 2023-06-22 MED ORDER — MONTELUKAST SODIUM 10 MG PO TABS: 10.0000 mg | ORAL_TABLET | Freq: Every day | ORAL | 0 refills | Status: DC

## 2023-07-03 ENCOUNTER — Other Ambulatory Visit: Payer: Self-pay | Admitting: Pulmonary Disease

## 2023-07-03 DIAGNOSIS — J4540 Moderate persistent asthma, uncomplicated: Secondary | ICD-10-CM

## 2023-07-06 ENCOUNTER — Encounter: Payer: Self-pay | Admitting: Pulmonary Disease

## 2023-07-06 ENCOUNTER — Ambulatory Visit: Payer: No Typology Code available for payment source | Admitting: Pulmonary Disease

## 2023-07-06 VITALS — BP 121/81 | HR 97 | Ht 69.0 in | Wt 237.0 lb

## 2023-07-06 DIAGNOSIS — J302 Other seasonal allergic rhinitis: Secondary | ICD-10-CM

## 2023-07-06 DIAGNOSIS — J4540 Moderate persistent asthma, uncomplicated: Secondary | ICD-10-CM | POA: Diagnosis not present

## 2023-07-06 MED ORDER — FLUTICASONE FUROATE-VILANTEROL 100-25 MCG/ACT IN AEPB: 1.0000 | INHALATION_SPRAY | Freq: Every day | RESPIRATORY_TRACT | 11 refills | Status: AC

## 2023-07-06 NOTE — Progress Notes (Signed)
 Synopsis: Referred in November 2022 for Asthma by Dahlia Byes, NP  Subjective:   PATIENT ID: Kelly Burke GENDER: female DOB: [birth year], MRN: [id number]  HPI  Chief Complaint  Patient presents with   Follow-up   Kelly Burke is a 38 year old woman, never smoker who returns to pulmonary clinic for asthma.   She is currently on Breo 200 mcg, Zyrtec, Singulair, Flonase, and uses albuterol as needed. She rarely needs albuterol, using it maybe once or twice a month, and is not experiencing nocturnal symptoms such as cough, wheezing, or shortness of breath. Her asthma symptoms worsen with changes in weather, particularly when it rains or gets warmer.   She has been recently diagnosed with Postural Orthostatic Tachycardia Syndrome (POTS) and underwent a full cardiology workup due to severe tachycardia. The workup included an electrocardiogram, which was normal, and she was found to have premature heart contractions. She is managing her condition with non-pharmacological therapies such as wearing compression socks and increasing sodium intake, which have been helpful.  She has a history of arthritis, which is currently well-controlled.   She experiences occasional reflux.  OV 03/06/22 She reports her asthma has been under good control with Breo 200-56mcg 1 puff daily. She switched to zyrtec from allegra. She is taking montelukast daily. She only had an increase in asthma symptoms during allergy seasons and needed albuterol rarely. She does not have night time awakenings. She denies issues with GERD. She does have post-nasal drainage.   She is followed by Dr. Dierdre Forth for inflammatory arthritis and is taking plaquenil.   OV 07/26/21 She has been doing well since last visit.  She reports improvement in her asthma symptoms since being started on Breo Ellipta 200-25 mcg 1 puff daily.  She reports rare use of albuterol inhaler.  She had flu a couple months ago where she had an increase in symptoms  but these have returned back to normal.  She continues to use Flonase, Allegra and Singulair for allergies.  She denies any nighttime awakenings.  OV 03/20/21 She reports having asthma since 38 years old in which she only required and as needed albuterol inhaler.  Over this year she reports increasing shortness of breath, chest tightness and wheezing.  Her primary care team started her on inhaled corticosteroid therapy and recently increased her Flovent discus to 250 MCG 1 puff twice daily.  She continues to use albuterol on a regular basis 2-4 times per day.  She denies any nighttime awakenings with cough, wheezing or shortness of breath.  She reports significant environmental allergies worse in the spring and fall.  She does have cats at home and carpet flooring in the upstairs of her home and in her bedroom.  She is using Pepcid 10 mg twice daily for history of GERD.  She reports it is well controlled at this time as she is avoiding trigger foods.  She does drink caffeine throughout the day.  She is using fluticasone nasal spray 1 spray per nostril daily for allergies.  She is also taking Allegra daily.  She previously tried Singulair in the summer for 1 month and did not report much improvement in her symptoms.  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   Past Medical History:  Diagnosis Date   Asthma    Autoimmune disease (HCC)    Postural orthostatic tachycardia syndrome (POTS)      Family History  Problem Relation Age of Onset   Asthma  Mother    Heart disease Father    Asthma Brother      Social History   Socioeconomic History   Marital status: Married    Spouse name: Not on file   Number of children: Not on file   Years of education: Not on file   Highest education level: Not on file  Occupational History   Not on file  Tobacco Use   Smoking status: Never   Smokeless tobacco: Never  Substance and Sexual Activity   Alcohol use: No    Drug use: No   Sexual activity: Never  Other Topics Concern   Not on file  Social History Narrative   Not on file   Social Drivers of Health   Financial Resource Strain: Not on file  Food Insecurity: Not on file  Transportation Needs: Not on file  Physical Activity: Not on file  Stress: Not on file  Social Connections: Not on file  Intimate Partner Violence: Not on file     Allergies  Allergen Reactions   Latex    Nexium [Esomeprazole]      Outpatient Medications Prior to Visit  Medication Sig Dispense Refill   albuterol (VENTOLIN HFA) 108 (90 Base) MCG/ACT inhaler Inhale into the lungs.     B Complex Vitamins (VITAMIN B COMPLEX) TABS See admin instructions.     Bromelain 250 MG CAPS      cetirizine (ZYRTEC) 10 MG tablet Take 10 mg by mouth daily.     Cholecalciferol (VITAMIN D) 50 MCG (2000 UT) CAPS 1 capsule     famotidine (PEPCID) 10 MG tablet 1 tablet as needed     fluticasone (FLONASE) 50 MCG/ACT nasal spray 1 spray in each nostril     gabapentin (NEURONTIN) 100 MG capsule Take 100 mg by mouth 3 (three) times daily as needed.     hydroxychloroquine (PLAQUENIL) 200 MG tablet 1 tablet     Melatonin 1 MG CHEW      montelukast (SINGULAIR) 10 MG tablet Take 1 tablet (10 mg total) by mouth at bedtime. 90 tablet 0   naproxen sodium (ALEVE) 220 MG tablet 1 tablet with food or milk as needed Orally every 12 hrs     polyethylene glycol (MIRALAX) 17 g packet 1 packet mixed with 8 ounces of fluid Orally Once a day for 30 day(s)     Quercetin 50 MG TABS Take 120 Orally Once a day     saccharomyces boulardii (FLORASTOR) 250 MG capsule 1 capsule Orally Twice a day for 30 day(s)     sertraline (ZOLOFT) 100 MG tablet TAKE 1 & 1/2 TABLET BY MOUTH ONCE A DAY *PER DR, NEED APPT*     Turmeric 08-998 MG CAPS      fluticasone furoate-vilanterol (BREO ELLIPTA) 200-25 MCG/ACT AEPB INHALE 1 PUFF INTO THE LUNGS DAILY. PT IS SCHEDULED FOR OUR OFFICE IN MARCH, NOT APRIL. WE CAN GRANT A COURTESY  REFILL, BUT ABSOLUTELY NO REFILLS WILL BE SENT FURTHER IF SHE IS NOT SEEN. 60 each 0   No facility-administered medications prior to visit.    Review of Systems  Constitutional:  Negative for chills, fever, malaise/fatigue and weight loss.  HENT:  Negative for congestion, sinus pain and sore throat.   Eyes: Negative.   Respiratory:  Negative for cough, hemoptysis, sputum production, shortness of breath and wheezing.   Cardiovascular:  Negative for chest pain, palpitations, orthopnea, claudication and leg swelling.  Gastrointestinal:  Negative for abdominal pain, heartburn, nausea and vomiting.  Genitourinary:  Negative.   Musculoskeletal:  Negative for joint pain and myalgias.  Skin:  Negative for rash.  Neurological:  Negative for weakness.  Endo/Heme/Allergies: Negative.   Psychiatric/Behavioral: Negative.      Objective:   Vitals:   07/06/23 1040  BP: 121/81  Pulse: 97  SpO2: 96%  Weight: 237 lb (107.5 kg)  Height: 5\' 9"  (1.753 m)    Physical Exam Constitutional:      General: She is not in acute distress.    Appearance: She is not ill-appearing.  HENT:     Head: Normocephalic and atraumatic.  Eyes:     General: No scleral icterus.    Conjunctiva/sclera: Conjunctivae normal.  Cardiovascular:     Rate and Rhythm: Normal rate and regular rhythm.     Pulses: Normal pulses.     Heart sounds: Normal heart sounds. No murmur heard. Pulmonary:     Effort: Pulmonary effort is normal.     Breath sounds: Normal breath sounds. No wheezing, rhonchi or rales.  Musculoskeletal:     Right lower leg: No edema.     Left lower leg: No edema.  Skin:    General: Skin is warm and dry.  Neurological:     Mental Status: She is alert.    CBC No results found for: "WBC", "RBC", "HGB", "HCT", "PLT", "MCV", "MCH", "MCHC", "RDW", "LYMPHSABS", "MONOABS", "EOSABS", "BASOSABS"   Chest imaging:  PFT:    Latest Ref Rng & Units 07/26/2021   10:55 AM  PFT Results  FVC-Pre L 5.22    FVC-Predicted Pre % 120   FVC-Post L 5.24   FVC-Predicted Post % 121   Pre FEV1/FVC % % 83   Post FEV1/FCV % % 83   FEV1-Pre L 4.35   FEV1-Predicted Pre % 121   FEV1-Post L 4.34   DLCO uncorrected ml/min/mmHg 30.91   DLCO UNC% % 119   DLCO corrected ml/min/mmHg 30.91   DLCO COR %Predicted % 119   DLVA Predicted % 105   TLC L 6.78   TLC % Predicted % 118   RV % Predicted % 88   PFTs 2023: Within normal limits  Labs:  Path:  Echo:  Heart Catheterization:  Assessment & Plan:   Moderate persistent asthma without complication - Plan: fluticasone furoate-vilanterol (BREO ELLIPTA) 100-25 MCG/ACT AEPB  Seasonal allergies  Discussion: Kelly Burke is a 38 year old woman, never smoker who returns to pulmonary clinic for asthma.   She has moderate persistent asthma that is well controlled with Breo Ellipta 200-25 mcg 1 puff daily. Will try reducing dose of breo and monitoring for increase in her symptoms. She is using albuterol inhaler as needed.  She is to continue montelukast 10 mg daily along with zyrtec and fluticasone nasal spray for her allergies.  Follow-up in 1 year.  Melody Comas, MD [facility] Office: [phone]   Current Outpatient Medications:    albuterol (VENTOLIN HFA) 108 (90 Base) MCG/ACT inhaler, Inhale into the lungs., Disp: , Rfl:    B Complex Vitamins (VITAMIN B COMPLEX) TABS, See admin instructions., Disp: , Rfl:    Bromelain 250 MG CAPS, , Disp: , Rfl:    cetirizine (ZYRTEC) 10 MG tablet, Take 10 mg by mouth daily., Disp: , Rfl:    Cholecalciferol (VITAMIN D) 50 MCG (2000 UT) CAPS, 1 capsule, Disp: , Rfl:    famotidine (PEPCID) 10 MG tablet, 1 tablet as needed, Disp: , Rfl:    fluticasone (FLONASE) 50 MCG/ACT nasal spray, 1  spray in each nostril, Disp: , Rfl:    fluticasone furoate-vilanterol (BREO ELLIPTA) 100-25 MCG/ACT AEPB, Inhale 1 puff into the lungs daily., Disp: 28 each, Rfl: 11   gabapentin (NEURONTIN) 100 MG  capsule, Take 100 mg by mouth 3 (three) times daily as needed., Disp: , Rfl:    hydroxychloroquine (PLAQUENIL) 200 MG tablet, 1 tablet, Disp: , Rfl:    Melatonin 1 MG CHEW, , Disp: , Rfl:    montelukast (SINGULAIR) 10 MG tablet, Take 1 tablet (10 mg total) by mouth at bedtime., Disp: 90 tablet, Rfl: 0   naproxen sodium (ALEVE) 220 MG tablet, 1 tablet with food or milk as needed Orally every 12 hrs, Disp: , Rfl:    polyethylene glycol (MIRALAX) 17 g packet, 1 packet mixed with 8 ounces of fluid Orally Once a day for 30 day(s), Disp: , Rfl:    Quercetin 50 MG TABS, Take 120 Orally Once a day, Disp: , Rfl:    saccharomyces boulardii (FLORASTOR) 250 MG capsule, 1 capsule Orally Twice a day for 30 day(s), Disp: , Rfl:    sertraline (ZOLOFT) 100 MG tablet, TAKE 1 & 1/2 TABLET BY MOUTH ONCE A DAY *PER DR, NEED APPT*, Disp: , Rfl:    Turmeric 08-998 MG CAPS, , Disp: , Rfl:

## 2023-07-06 NOTE — Patient Instructions (Addendum)
 We will reduce your Breo inhaler dose from to , 1 puff daily - rinse mouth out after each use  Continue allergy regimen with zyrtec, montelukast and flonase  Follow up in 1 year, call sooner if needed

## 2023-07-08 ENCOUNTER — Other Ambulatory Visit (HOSPITAL_COMMUNITY): Payer: Self-pay

## 2023-07-08 ENCOUNTER — Telehealth: Payer: Self-pay

## 2023-07-08 NOTE — Telephone Encounter (Signed)
*  Pulm  Pharmacy Patient Advocate Encounter   Received notification from CoverMyMeds that prior authorization for Fluticasone-Salmeterol 100-50MCG/ACT aerosol powder  is required/requested.   Insurance verification completed.   The patient is insured through [facility] .   Per test claim:  Brand Earlie Server is preferred by the insurance.  If suggested medication is appropriate, Please send in a new RX and discontinue this one. If not, please advise as to why it's not appropriate so that we may request a Prior Authorization. Please note, some preferred medications may still require a PA.  If the suggested medications have not been trialed and there are no contraindications to their use, the PA will not be submitted, as it will not be approved.   *refill too soon- last fill 03/14 next fill 04/08

## 2023-07-12 ENCOUNTER — Encounter: Payer: Self-pay | Admitting: Pulmonary Disease

## 2023-09-24 ENCOUNTER — Other Ambulatory Visit: Payer: Self-pay | Admitting: Pulmonary Disease

## 2023-09-24 DIAGNOSIS — J4540 Moderate persistent asthma, uncomplicated: Secondary | ICD-10-CM

## 2023-12-23 ENCOUNTER — Other Ambulatory Visit: Payer: Self-pay | Admitting: Pulmonary Disease

## 2023-12-23 DIAGNOSIS — J4540 Moderate persistent asthma, uncomplicated: Secondary | ICD-10-CM

## 2024-03-29 ENCOUNTER — Encounter (HOSPITAL_COMMUNITY): Payer: Self-pay | Admitting: Emergency Medicine

## 2024-03-29 ENCOUNTER — Emergency Department (HOSPITAL_COMMUNITY)

## 2024-03-29 ENCOUNTER — Other Ambulatory Visit: Payer: Self-pay

## 2024-03-29 ENCOUNTER — Emergency Department (HOSPITAL_COMMUNITY)
Admission: EM | Admit: 2024-03-29 | Discharge: 2024-03-30 | Disposition: A | Attending: Emergency Medicine | Admitting: Emergency Medicine

## 2024-03-29 HISTORY — DX: Irritable bowel syndrome, unspecified: K58.9

## 2024-03-29 LAB — URINALYSIS, ROUTINE W REFLEX MICROSCOPIC
Bilirubin Urine: NEGATIVE
Glucose, UA: NEGATIVE mg/dL
Hgb urine dipstick: NEGATIVE
Ketones, ur: NEGATIVE mg/dL
Nitrite: NEGATIVE
Protein, ur: NEGATIVE mg/dL
Specific Gravity, Urine: 1.002 — ABNORMAL LOW (ref 1.005–1.030)
pH: 6 (ref 5.0–8.0)

## 2024-03-29 LAB — BASIC METABOLIC PANEL WITH GFR
Anion gap: 13 (ref 5–15)
BUN: 14 mg/dL (ref 6–20)
CO2: 20 mmol/L — ABNORMAL LOW (ref 22–32)
Calcium: 8.7 mg/dL — ABNORMAL LOW (ref 8.9–10.3)
Chloride: 101 mmol/L (ref 98–111)
Creatinine, Ser: 0.76 mg/dL (ref 0.44–1.00)
GFR, Estimated: >60 mL/min (ref >60)
Glucose, Bld: 111 mg/dL — ABNORMAL HIGH (ref 70–99)
Potassium: 3.9 mmol/L (ref 3.5–5.1)
Sodium: 134 mmol/L — ABNORMAL LOW (ref 135–145)

## 2024-03-29 LAB — HCG, SERUM, QUALITATIVE: Preg, Serum: NEGATIVE

## 2024-03-29 LAB — CBC
HCT: 42.4 % (ref 36.0–46.0)
Hemoglobin: 14.2 g/dL (ref 12.0–15.0)
MCH: 30.6 pg (ref 26.0–34.0)
MCHC: 33.5 g/dL (ref 30.0–36.0)
MCV: 91.4 fL (ref 80.0–100.0)
Platelets: 317 K/uL (ref 150–400)
RBC: 4.64 MIL/uL (ref 3.87–5.11)
RDW: 12.6 % (ref 11.5–15.5)
WBC: 9.4 K/uL (ref 4.0–10.5)
nRBC: 0 % (ref 0.0–0.2)

## 2024-03-29 NOTE — ED Provider Triage Note (Signed)
 Emergency Medicine Provider Triage Evaluation Note  Kelly Burke , a 38 y.o. female  was evaluated in triage.  Pt complains of abdominal and flank pain. Pt just finish abx for a UTI on Friday. The UTI was complicated by being resistant to the first abx. She is now having lower left abdominal pain and into her flank. She felt as though she had a fever earlier today but does not have one in the ER. She does not have any dysuria. No increase in urination.   Review of Systems  Positive: Flank pain Negative: Dysuria   Physical Exam  BP (!) 144/81 (BP Location: Right Arm)   Pulse 89   Temp 98.3 F (36.8 C) (Oral)   Resp 17   SpO2 98%  Gen:   Awake, no distress   Resp:  Normal effort  MSK:   Moves extremities without difficulty  Other:  CVA tenderness to the left side and pain to the lower left abdomin  Medical Decision Making  Medically screening exam initiated at 7:10 PM.  Appropriate orders placed.  Kelly Burke was informed that the remainder of the evaluation will be completed by another provider, this initial triage assessment does not replace that evaluation, and the importance of remaining in the ED until their evaluation is complete.     Kelly Burke, [place] 03/29/24 1918

## 2024-03-29 NOTE — ED Triage Notes (Signed)
 PT comes in for flank pain.  She has had 2 rounds of oral ABX r/t a UTI dx 2 weeks ago.  Pt has had nausea, low grade fever, chills back pain and lower abd pain for the last 24 hrs ago.

## 2024-03-30 ENCOUNTER — Encounter: Payer: Self-pay | Admitting: Cardiology

## 2024-03-30 MED ORDER — SULFAMETHOXAZOLE-TRIMETHOPRIM 800-160 MG PO TABS
1.0000 | ORAL_TABLET | Freq: Once | ORAL | Status: AC
  Administered 2024-03-30: 1 via ORAL
  Filled 2024-03-30: qty 1

## 2024-03-30 MED ORDER — SULFAMETHOXAZOLE-TRIMETHOPRIM 800-160 MG PO TABS: 1.0000 | ORAL_TABLET | Freq: Two times a day (BID) | ORAL | 0 refills | Status: DC

## 2024-03-30 MED ORDER — SULFAMETHOXAZOLE-TRIMETHOPRIM 800-160 MG PO TABS: 1.0000 | ORAL_TABLET | Freq: Two times a day (BID) | ORAL | 0 refills | Status: AC

## 2024-03-30 NOTE — Discharge Instructions (Signed)
 Take the prescribed medication as directed.  This antibiotic does cover against prior bacteria on last culture.  Repeat culture from today's sample is pending.  You will be notified if any changes need to be made to medications. Follow-up with urology-- I would call in the morning to get this scheduled. Return to the ED for new or worsening symptoms.

## 2024-03-30 NOTE — ED Provider Notes (Signed)
 [place] EM[facility]GENC[facility]PARTMENT AT [facility] Provider Note   CSN: [id number] Arrival date & time: 03/29/24  [id number]     Patient presents with: No chief complaint on file.   Kelly Burke is a 38 y.o. female.   The history is provided by the patient and medical records.   38 y.o. F with hx of IBS, asthma, POTS, presenting to the ED for recurrent UTI.  States this is the 3rd time in the past month symptoms have recurred.  Initially treated with macrobid but switched to ciprofloxacin due to culture growing out citrobacter kersi.  States symptoms got better for 1-2 days but came back again.  She reports next time she was treated with cefdinir which helped, but still had breakthrough symptom was even while taking antibiotics.  She does report fevers at home but is afebrile here.  She reports continued dysuria and frequency.  She denies any significant hematuria.  No passage of fragments.  No prior history of kidney stones.  She has never had recurrent UTIs like this in the past.  Last incidence was about 10 years ago.  Prior to Admission medications   Medication Sig Start Date End Date Taking? Authorizing Provider  albuterol (VENTOLIN HFA) 108 (90 Base) MCG/ACT inhaler Inhale into the lungs. 11/08/20   [provider]  B Complex Vitamins (VITAMIN B COMPLEX) TABS See admin instructions.    [provider]  Bromelain 250 MG CAPS  09/20/22   [provider]  cetirizine (ZYRTEC) 10 MG tablet Take 10 mg by mouth daily.    [provider]  Cholecalciferol (VITAMIN D) 50 MCG (2000 UT) CAPS 1 capsule 02/19/18   [provider]  famotidine (PEPCID) 10 MG tablet 1 tablet as needed 06/02/15   [provider]  fluticasone  (FLONASE) 50 MCG/ACT nasal spray 1 spray in each nostril 04/21/98   [provider]  fluticasone  furoate-vilanterol (BREO ELLIPTA ) 100-25 MCG/ACT AEPB Inhale 1 puff into the lungs daily. 07/06/23   Kara Dorn NOVAK, MD   gabapentin (NEURONTIN) 100 MG capsule Take 100 mg by mouth 3 (three) times daily as needed. 02/09/21   [provider]  hydroxychloroquine (PLAQUENIL) 200 MG tablet 1 tablet 03/02/19   [provider]  Melatonin 1 MG CHEW  02/24/16   [provider]  montelukast  (SINGULAIR ) 10 MG tablet TAKE 1 TABLET BY MOUTH EVERYDAY AT BEDTIME 12/23/23   Kara Dorn NOVAK, MD  naproxen sodium (ALEVE) 220 MG tablet 1 tablet with food or milk as needed Orally every 12 hrs    [provider]  polyethylene glycol (MIRALAX) 17 g packet 1 packet mixed with 8 ounces of fluid Orally Once a day for 30 day(s)    [provider]  Quercetin 50 MG TABS Take 120 Orally Once a day    [provider]  saccharomyces boulardii (FLORASTOR) 250 MG capsule 1 capsule Orally Twice a day for 30 day(s)    [provider]  sertraline (ZOLOFT) 100 MG tablet TAKE 1 & 1/2 TABLET BY MOUTH ONCE A DAY *PER DR, NEED APPT* 02/20/16   [provider]  Turmeric 08-998 MG CAPS  02/24/20   [provider]    Allergies: Latex and Nexium [esomeprazole]    Review of Systems  Genitourinary:  Positive for dysuria and frequency.  All other systems reviewed and are negative.   Updated Vital Signs BP 132/88 (BP Location: Right Arm)   Pulse 75   Temp 97.8 F (36.6  C)   Resp 15   SpO2 100%   Physical Exam Vitals and nursing note reviewed.  Constitutional:      Appearance: She is well-developed.  HENT:     Head: Normocephalic and atraumatic.  Eyes:     Conjunctiva/sclera: Conjunctivae normal.     Pupils: Pupils are equal, round, and reactive to light.  Cardiovascular:     Rate and Rhythm: Normal rate and regular rhythm.     Heart sounds: Normal heart sounds.  Pulmonary:     Effort: Pulmonary effort is normal.     Breath sounds: Normal breath sounds. No rhonchi.  Abdominal:     General: Bowel sounds are normal.     Palpations: Abdomen is soft.      Tenderness: There is abdominal tenderness in the suprapubic area.     Comments: Very mild tenderness to the left suprapubic area, no peritoneal signs; no CVA tenderness  Musculoskeletal:        General: Normal range of motion.     Cervical back: Normal range of motion.  Skin:    General: Skin is warm and dry.  Neurological:     Mental Status: She is alert and oriented to person, place, and time.     (all labs ordered are listed, but only abnormal results are displayed) Labs Reviewed  BASIC METABOLIC PANEL WITH GFR - Abnormal; Notable for the following components:      Result Value   Sodium 134 (*)    CO2 20 (*)    Glucose, Bld 111 (*)    Calcium 8.7 (*)    All other components within normal limits  URINALYSIS, ROUTINE W REFLEX MICROSCOPIC - Abnormal; Notable for the following components:   Color, Urine STRAW (*)    APPearance HAZY (*)    Specific Gravity, Urine 1.002 (*)    Leukocytes,Ua TRACE (*)    Bacteria, UA MANY (*)    All other components within normal limits  CBC  HCG, SERUM, QUALITATIVE    EKG: None  Radiology: CT RENAL STONE STUDY Result Date: 03/29/2024 CLINICAL DATA:  Abdominal and flank pain EXAM: CT ABDOMEN AND PELVIS WITHOUT CONTRAST TECHNIQUE: Multidetector CT imaging of the abdomen and pelvis was performed following the standard protocol without IV contrast. RADIATION DOSE REDUCTION: This exam was performed according to the departmental dose-optimization program which includes automated exposure control, adjustment of the mA and/or kV according to patient size and/or use of iterative reconstruction technique. COMPARISON:  06/01/2015 FINDINGS: Lower chest: No acute pleural or parenchymal lung disease. Hepatobiliary: Unremarkable unenhanced appearance of the liver and gallbladder. Pancreas: Unremarkable unenhanced appearance. Spleen: Unremarkable unenhanced appearance. Adrenals/Urinary Tract: No urinary tract calculi or obstructive uropathy within either kidney.  The adrenals and bladder are unremarkable. Stomach/Bowel: No bowel obstruction or ileus. Normal appendix right lower quadrant. No bowel wall thickening or inflammatory change. Vascular/Lymphatic: No significant vascular findings are present. No enlarged abdominal or pelvic lymph nodes. Reproductive: Uterus and bilateral adnexa are unremarkable. Other: No free fluid or free intraperitoneal gas. No abdominal wall hernia. Musculoskeletal: No acute or destructive bony abnormalities. Reconstructed images demonstrate no additional findings. IMPRESSION: 1. Unremarkable unenhanced CT of the abdomen and pelvis. No urinary tract calculi or obstructive uropathy. Electronically Signed   By: Ozell Daring M.D.   On: 03/29/2024 21:34     Procedures   Medications Ordered in the ED - No data to display  Medical Decision Making Amount and/or Complexity of Data Reviewed Labs: ordered. ECG/medicine tests: ordered and independent interpretation performed.  Risk Prescription drug management.   38 y.o. F here with recurrent UTI symptoms.  Treated x2 over the past few weeks for same with incomplete resolution of symptoms.  No prior significant urologic history previously.  She is afebrile and nontoxic in appearance here.  She has some very mild tenderness to her left suprapubic area but does not have any focal CVA tenderness on exam.  Labs are obtained without leukocytosis or electrolyte derangement.  UA with many bacteria, small leuks.  CT without any findings of pyelonephritis or acute stone.  Prior culture grew out Citrobacter koseri which I have reviewed in mychart.  Unfortunately, does not appear to have sensitivity testing but she was told this was resistant to macrobid.  She has already completed course of fluoroquinolones as well as cephalosporins with incomplete resolution.  Can consider partially treated infection.  Will switch to bactrim  which will provide coverage for  this pending repeat culture sent today.  I have recommended that she follow-up with urology.   Can return here for new concerns.  Final diagnoses:  Acute cystitis without hematuria  Dysuria    ED Discharge Orders          Ordered     03/30/24 0400    sulfamethoxazole -trimethoprim  (BACTRIM  DS) 800-160 MG tablet  2 times daily        03/30/24 0431               Jarold Olam HERO, PA-C 03/30/24 0544    Trine Raynell Moder, MD 03/30/24 [phone]

## 2024-04-01 LAB — URINE CULTURE: Culture: 30000 — AB

## 2024-04-02 ENCOUNTER — Telehealth (HOSPITAL_BASED_OUTPATIENT_CLINIC_OR_DEPARTMENT_OTHER): Payer: Self-pay | Admitting: *Deleted

## 2024-04-02 NOTE — Telephone Encounter (Signed)
 Post ED Visit - Positive Culture Follow-up  Culture report reviewed by antimicrobial stewardship pharmacist: [facility] []  Rankin Dee, Pharm.D. []  Venetia Gully, Pharm.D., BCPS AQ-ID []  Garrel Crews, Pharm.D., BCPS []  Almarie Lunger, Pharm.D., BCPS []  [place], [place].D., BCPS, AAHIVP []  Rosaline Bihari, Pharm.D., BCPS, AAHIVP []  Vernell Meier, PharmD, BCPS []  Latanya Hint, PharmD, BCPS []  Donald Medley, PharmD, BCPS []  Rocky Bold, PharmD []  Dorothyann Alert, PharmD, BCPS [x]  Dorn Poot,  PharmD  [facility] []  Rosaline Edison, PharmD []  Romona Bliss, PharmD []  Dolphus Roller, PharmD []  Veva Seip, Rph []  Vernell Daunt) Leonce, PharmD []  Eva Allis, PharmD []  Rosaline Millet, PharmD []  Iantha Batch, PharmD []  Arvin Gauss, PharmD []  Wanda Hasting, PharmD []  Ronal Rav, PharmD []  Rocky Slade, PharmD []  Bard Jeans, PharmD   Positive urine culture Treated with Sulfamethoxazole -Trimethoprim , organism sensitive to the same and no further patient follow-up is required at this time.  Kelly Burke 04/02/2024, 9:46 AM
